# Patient Record
Sex: Male | Born: 1965 | Race: White | Hispanic: No | Marital: Married | Smoking: Never smoker
Health system: Southern US, Community
[De-identification: ages and names within clinical notes are randomized; demographics above are authoritative.]

## PROBLEM LIST (undated history)

## (undated) DIAGNOSIS — F32A Depression, unspecified: Secondary | ICD-10-CM

## (undated) DIAGNOSIS — F419 Anxiety disorder, unspecified: Secondary | ICD-10-CM

## (undated) DIAGNOSIS — G4700 Insomnia, unspecified: Secondary | ICD-10-CM

## (undated) DIAGNOSIS — I10 Essential (primary) hypertension: Secondary | ICD-10-CM

## (undated) DIAGNOSIS — F329 Major depressive disorder, single episode, unspecified: Secondary | ICD-10-CM

## (undated) DIAGNOSIS — F1011 Alcohol abuse, in remission: Secondary | ICD-10-CM

## (undated) DIAGNOSIS — G8929 Other chronic pain: Secondary | ICD-10-CM

## (undated) HISTORY — DX: Depression, unspecified: F32.A

## (undated) HISTORY — DX: Insomnia, unspecified: G47.00

## (undated) HISTORY — DX: Essential (primary) hypertension: I10

## (undated) HISTORY — DX: Other chronic pain: G89.29

## (undated) HISTORY — DX: Alcohol abuse, in remission: F10.11

## (undated) HISTORY — DX: Major depressive disorder, single episode, unspecified: F32.9

## (undated) HISTORY — DX: Anxiety disorder, unspecified: F41.9

## (undated) HISTORY — PX: CHOLECYSTECTOMY: SHX55

---

## 2000-03-23 ENCOUNTER — Emergency Department (HOSPITAL_COMMUNITY): Admission: EM | Admit: 2000-03-23 | Discharge: 2000-03-23 | Payer: Self-pay | Admitting: Emergency Medicine

## 2001-09-12 ENCOUNTER — Emergency Department (HOSPITAL_COMMUNITY): Admission: EM | Admit: 2001-09-12 | Discharge: 2001-09-12 | Payer: Self-pay | Admitting: *Deleted

## 2006-01-18 ENCOUNTER — Emergency Department (HOSPITAL_COMMUNITY): Admission: EM | Admit: 2006-01-18 | Discharge: 2006-01-18 | Payer: Self-pay | Admitting: Emergency Medicine

## 2006-04-06 ENCOUNTER — Emergency Department (HOSPITAL_COMMUNITY): Admission: EM | Admit: 2006-04-06 | Discharge: 2006-04-06 | Payer: Self-pay | Admitting: Family Medicine

## 2006-08-05 ENCOUNTER — Emergency Department (HOSPITAL_COMMUNITY): Admission: EM | Admit: 2006-08-05 | Discharge: 2006-08-05 | Payer: Self-pay | Admitting: Emergency Medicine

## 2006-08-09 ENCOUNTER — Emergency Department (HOSPITAL_COMMUNITY): Admission: EM | Admit: 2006-08-09 | Discharge: 2006-08-09 | Payer: Self-pay | Admitting: Emergency Medicine

## 2006-08-18 ENCOUNTER — Inpatient Hospital Stay (HOSPITAL_COMMUNITY): Admission: EM | Admit: 2006-08-18 | Discharge: 2006-08-23 | Payer: Self-pay | Admitting: Emergency Medicine

## 2006-08-18 ENCOUNTER — Encounter (INDEPENDENT_AMBULATORY_CARE_PROVIDER_SITE_OTHER): Payer: Self-pay | Admitting: Surgery

## 2007-07-07 ENCOUNTER — Emergency Department (HOSPITAL_COMMUNITY): Admission: EM | Admit: 2007-07-07 | Discharge: 2007-07-07 | Payer: Self-pay | Admitting: Emergency Medicine

## 2007-07-14 ENCOUNTER — Emergency Department (HOSPITAL_COMMUNITY): Admission: EM | Admit: 2007-07-14 | Discharge: 2007-07-14 | Payer: Self-pay | Admitting: Emergency Medicine

## 2008-01-03 ENCOUNTER — Emergency Department (HOSPITAL_COMMUNITY): Admission: EM | Admit: 2008-01-03 | Discharge: 2008-01-03 | Payer: Self-pay | Admitting: Emergency Medicine

## 2008-03-04 ENCOUNTER — Emergency Department (HOSPITAL_COMMUNITY): Admission: EM | Admit: 2008-03-04 | Discharge: 2008-03-04 | Payer: Self-pay | Admitting: Emergency Medicine

## 2008-04-11 ENCOUNTER — Inpatient Hospital Stay (HOSPITAL_COMMUNITY): Admission: EM | Admit: 2008-04-11 | Discharge: 2008-04-14 | Payer: Self-pay | Admitting: Emergency Medicine

## 2008-05-03 ENCOUNTER — Emergency Department (HOSPITAL_COMMUNITY): Admission: EM | Admit: 2008-05-03 | Discharge: 2008-05-04 | Payer: Self-pay | Admitting: Emergency Medicine

## 2009-08-26 ENCOUNTER — Emergency Department (HOSPITAL_COMMUNITY): Admission: EM | Admit: 2009-08-26 | Discharge: 2009-08-26 | Payer: Self-pay | Admitting: Emergency Medicine

## 2009-08-27 ENCOUNTER — Inpatient Hospital Stay (HOSPITAL_COMMUNITY): Admission: EM | Admit: 2009-08-27 | Discharge: 2009-08-29 | Payer: Self-pay | Admitting: Emergency Medicine

## 2010-03-28 LAB — CBC
HCT: 36.3 % — ABNORMAL LOW (ref 39.0–52.0)
HCT: 40.8 % (ref 39.0–52.0)
HCT: 41.4 % (ref 39.0–52.0)
HCT: 42.3 % (ref 39.0–52.0)
Hemoglobin: 12.7 g/dL — ABNORMAL LOW (ref 13.0–17.0)
Hemoglobin: 14 g/dL (ref 13.0–17.0)
Hemoglobin: 14.4 g/dL (ref 13.0–17.0)
Hemoglobin: 14.9 g/dL (ref 13.0–17.0)
Hemoglobin: 15.4 g/dL (ref 13.0–17.0)
MCH: 31.4 pg (ref 26.0–34.0)
MCH: 31.8 pg (ref 26.0–34.0)
MCHC: 34.9 g/dL (ref 30.0–36.0)
MCHC: 35.2 g/dL (ref 30.0–36.0)
MCV: 89.4 fL (ref 78.0–100.0)
MCV: 90.5 fL (ref 78.0–100.0)
MCV: 90.5 fL (ref 78.0–100.0)
MCV: 90.9 fL (ref 78.0–100.0)
Platelets: 185 10*3/uL (ref 150–400)
Platelets: 215 10*3/uL (ref 150–400)
RBC: 4.57 MIL/uL (ref 4.22–5.81)
RBC: 4.73 MIL/uL (ref 4.22–5.81)
RBC: 4.84 MIL/uL (ref 4.22–5.81)
RDW: 12.4 % (ref 11.5–15.5)
RDW: 13.1 % (ref 11.5–15.5)
RDW: 13.2 % (ref 11.5–15.5)
WBC: 13 10*3/uL — ABNORMAL HIGH (ref 4.0–10.5)
WBC: 15.9 10*3/uL — ABNORMAL HIGH (ref 4.0–10.5)
WBC: 9.6 10*3/uL (ref 4.0–10.5)

## 2010-03-28 LAB — BASIC METABOLIC PANEL
BUN: 3 mg/dL — ABNORMAL LOW (ref 6–23)
Chloride: 101 mEq/L (ref 96–112)
Chloride: 99 mEq/L (ref 96–112)
GFR calc Af Amer: >60 mL/min (ref >60)
GFR calc non Af Amer: >60 mL/min (ref >60)
GFR calc non Af Amer: >60 mL/min (ref >60)
Glucose, Bld: 124 mg/dL — ABNORMAL HIGH (ref 70–99)
Potassium: 3.6 mEq/L (ref 3.5–5.1)
Potassium: 4.7 mEq/L (ref 3.5–5.1)
Sodium: 132 mEq/L — ABNORMAL LOW (ref 135–145)
Sodium: 137 mEq/L (ref 135–145)

## 2010-03-28 LAB — URINALYSIS, ROUTINE W REFLEX MICROSCOPIC
Bilirubin Urine: NEGATIVE
Glucose, UA: NEGATIVE mg/dL
Ketones, ur: NEGATIVE mg/dL
Nitrite: NEGATIVE
Specific Gravity, Urine: 1.017 (ref 1.005–1.030)
Urobilinogen, UA: 0.2 mg/dL (ref 0.0–1.0)

## 2010-03-28 LAB — POCT I-STAT, CHEM 8
BUN: 13 mg/dL (ref 6–23)
Calcium, Ion: 1.08 mmol/L — ABNORMAL LOW (ref 1.12–1.32)
Chloride: 100 mEq/L (ref 96–112)
Creatinine, Ser: 1 mg/dL (ref 0.4–1.5)
HCT: 45 % (ref 39.0–52.0)
Potassium: 3.9 mEq/L (ref 3.5–5.1)
Sodium: 134 mEq/L — ABNORMAL LOW (ref 135–145)

## 2010-03-28 LAB — COMPREHENSIVE METABOLIC PANEL
ALT: 72 U/L — ABNORMAL HIGH (ref 0–53)
AST: 21 U/L (ref 0–37)
AST: 23 U/L (ref 0–37)
Albumin: 3.4 g/dL — ABNORMAL LOW (ref 3.5–5.2)
Alkaline Phosphatase: 45 U/L (ref 39–117)
Alkaline Phosphatase: 53 U/L (ref 39–117)
BUN: 14 mg/dL (ref 6–23)
BUN: 7 mg/dL (ref 6–23)
CO2: 25 mEq/L (ref 19–32)
Calcium: 8.5 mg/dL (ref 8.4–10.5)
Chloride: 99 mEq/L (ref 96–112)
Chloride: 99 mEq/L (ref 96–112)
Creatinine, Ser: 0.87 mg/dL (ref 0.4–1.5)
GFR calc Af Amer: >60 mL/min (ref >60)
GFR calc non Af Amer: >60 mL/min (ref >60)
Glucose, Bld: 120 mg/dL — ABNORMAL HIGH (ref 70–99)
Potassium: 4.1 mEq/L (ref 3.5–5.1)
Potassium: 4.3 mEq/L (ref 3.5–5.1)
Sodium: 131 mEq/L — ABNORMAL LOW (ref 135–145)
Sodium: 134 mEq/L — ABNORMAL LOW (ref 135–145)
Total Protein: 6.6 g/dL (ref 6.0–8.3)

## 2010-03-28 LAB — RAPID URINE DRUG SCREEN, HOSP PERFORMED
Amphetamines: NOT DETECTED
Cocaine: NOT DETECTED

## 2010-03-28 LAB — MAGNESIUM: Magnesium: 1.5 mg/dL (ref 1.5–2.5)

## 2010-03-28 LAB — LIPID PANEL
LDL Cholesterol: 31 mg/dL (ref 0–99)
Total CHOL/HDL Ratio: 2.1 RATIO
VLDL: 16 mg/dL (ref 0–40)

## 2010-03-28 LAB — DIFFERENTIAL
Basophils Absolute: 0.1 10*3/uL (ref 0.0–0.1)
Eosinophils Absolute: 0 10*3/uL (ref 0.0–0.7)
Eosinophils Relative: 2 % (ref 0–5)
Lymphs Abs: 0.7 10*3/uL (ref 0.7–4.0)

## 2010-03-28 LAB — PHOSPHORUS: Phosphorus: 3.2 mg/dL (ref 2.3–4.6)

## 2010-03-28 LAB — APTT: aPTT: 28 seconds (ref 24–37)

## 2010-03-28 LAB — PROTIME-INR
INR: 0.99 (ref 0.00–1.49)
Prothrombin Time: 13.3 seconds (ref 11.6–15.2)

## 2010-03-28 LAB — LIPASE, BLOOD: Lipase: 52 U/L (ref 11–59)

## 2010-04-23 LAB — CBC
HCT: 44 % (ref 39.0–52.0)
HCT: 38.9 % — ABNORMAL LOW (ref 39.0–52.0)
Hemoglobin: 13.5 g/dL (ref 13.0–17.0)
MCHC: 34.5 g/dL (ref 30.0–36.0)
MCV: 95.5 fL (ref 78.0–100.0)
MCV: 95.9 fL (ref 78.0–100.0)
Platelets: 257 10*3/uL (ref 150–400)
RDW: 12.6 % (ref 11.5–15.5)
RDW: 13.2 % (ref 11.5–15.5)

## 2010-04-23 LAB — URINALYSIS, ROUTINE W REFLEX MICROSCOPIC
Glucose, UA: NEGATIVE mg/dL
Hgb urine dipstick: NEGATIVE
Protein, ur: NEGATIVE mg/dL
pH: 6 (ref 5.0–8.0)

## 2010-04-23 LAB — POTASSIUM: Potassium: 4.9 mEq/L (ref 3.5–5.1)

## 2010-04-23 LAB — LIPASE, BLOOD: Lipase: 42 U/L (ref 11–59)

## 2010-04-23 LAB — COMPREHENSIVE METABOLIC PANEL
AST: 24 U/L (ref 0–37)
Albumin: 4.4 g/dL (ref 3.5–5.2)
BUN: 6 mg/dL (ref 6–23)
Calcium: 9.7 mg/dL (ref 8.4–10.5)
Creatinine, Ser: 0.9 mg/dL (ref 0.4–1.5)
GFR calc Af Amer: >60 mL/min (ref >60)
Total Bilirubin: 0.7 mg/dL (ref 0.3–1.2)
Total Protein: 7.2 g/dL (ref 6.0–8.3)

## 2010-04-23 LAB — DIFFERENTIAL
Basophils Absolute: 0 10*3/uL (ref 0.0–0.1)
Eosinophils Relative: 3 % (ref 0–5)
Lymphocytes Relative: 8 % — ABNORMAL LOW (ref 12–46)
Lymphocytes Relative: 7 % — ABNORMAL LOW (ref 12–46)
Lymphs Abs: 0.9 10*3/uL (ref 0.7–4.0)
Lymphs Abs: 0.7 10*3/uL (ref 0.7–4.0)
Monocytes Absolute: 0.5 10*3/uL (ref 0.1–1.0)
Monocytes Absolute: 0.9 10*3/uL (ref 0.1–1.0)
Monocytes Relative: 5 % (ref 3–12)
Neutro Abs: 9 10*3/uL — ABNORMAL HIGH (ref 1.7–7.7)

## 2010-04-23 LAB — BASIC METABOLIC PANEL
Chloride: 104 mEq/L (ref 96–112)
GFR calc non Af Amer: >60 mL/min (ref >60)
Glucose, Bld: 99 mg/dL (ref 70–99)
Potassium: 5.2 mEq/L — ABNORMAL HIGH (ref 3.5–5.1)
Sodium: 136 mEq/L (ref 135–145)

## 2010-04-23 LAB — RAPID URINE DRUG SCREEN, HOSP PERFORMED
Amphetamines: NOT DETECTED
Barbiturates: NOT DETECTED
Benzodiazepines: NOT DETECTED
Cocaine: NOT DETECTED

## 2010-04-24 LAB — RAPID URINE DRUG SCREEN, HOSP PERFORMED
Amphetamines: NOT DETECTED
Barbiturates: NOT DETECTED
Benzodiazepines: POSITIVE — AB
Cocaine: NOT DETECTED
Opiates: POSITIVE — AB

## 2010-04-24 LAB — DIFFERENTIAL
Basophils Relative: 0 % (ref 0–1)
Eosinophils Absolute: 0.2 10*3/uL (ref 0.0–0.7)
Neutro Abs: 11.9 10*3/uL — ABNORMAL HIGH (ref 1.7–7.7)
Neutrophils Relative %: 88 % — ABNORMAL HIGH (ref 43–77)

## 2010-04-24 LAB — URINE MICROSCOPIC-ADD ON

## 2010-04-24 LAB — COMPREHENSIVE METABOLIC PANEL
ALT: 68 U/L — ABNORMAL HIGH (ref 0–53)
Alkaline Phosphatase: 66 U/L (ref 39–117)
BUN: 5 mg/dL — ABNORMAL LOW (ref 6–23)
CO2: 22 mEq/L (ref 19–32)
Calcium: 8.6 mg/dL (ref 8.4–10.5)
GFR calc non Af Amer: >60 mL/min (ref >60)
Glucose, Bld: 125 mg/dL — ABNORMAL HIGH (ref 70–99)
Sodium: 124 mEq/L — ABNORMAL LOW (ref 135–145)

## 2010-04-24 LAB — URINALYSIS, ROUTINE W REFLEX MICROSCOPIC
Leukocytes, UA: NEGATIVE
Nitrite: NEGATIVE
Specific Gravity, Urine: 1.022 (ref 1.005–1.030)
Urobilinogen, UA: 1 mg/dL (ref 0.0–1.0)

## 2010-04-24 LAB — SALICYLATE LEVEL: Salicylate Lvl: <4 mg/dL (ref 2.8–20.0)

## 2010-04-24 LAB — LIPASE, BLOOD: Lipase: 340 U/L — ABNORMAL HIGH (ref 11–59)

## 2010-04-24 LAB — CBC
HCT: 46.8 % (ref 39.0–52.0)
Hemoglobin: 16.3 g/dL (ref 13.0–17.0)
MCHC: 34.7 g/dL (ref 30.0–36.0)
RBC: 4.93 MIL/uL (ref 4.22–5.81)

## 2010-04-24 LAB — ACETAMINOPHEN LEVEL: Acetaminophen (Tylenol), Serum: <10 ug/mL — ABNORMAL LOW (ref 10–30)

## 2010-05-27 NOTE — Consult Note (Signed)
NAMERUTLEDGE, SELSOR NO.:  [id number]   MEDICAL RECORD NO.:  [id number]          PATIENT TYPE:  INP   LOCATION:  1414                         FACILITY:  [facility]   PHYSICIAN:  Antonietta Breach, M.D.  DATE OF BIRTH:  1965-01-27   DATE OF CONSULTATION:  04/12/2008  DATE OF DISCHARGE:                                 CONSULTATION   REQUESTING PHYSICIAN:  Jacqulyn Bath Team.   REASON FOR CONSULTATION:  Anxiety, depression, alcohol dependence.   HISTORY OF PRESENT ILLNESS:  [name] is a 45 year old male  admitted to the [facility] on April 11, 2008, due to acute  pancreatitis and hyponatremia.   [name] has been drinking at least 8 beers per day for over 4 weeks.  He describes his mood as within normal limits.  He has constructive  interests and future goals.  He notes that he has obtained a new job.  He is not having any thoughts of harming himself or others.  He has no  hallucinations or delusions.  He is socially appropriate and  cooperative.  However, he does have some decreased energy.  He does have  some mild decreased concentration.   He does continue on his Prozac 40 mg daily and did recently restart it.  He stated that he was having difficulty with the cost of the medicine.  He does still take Xanax as an outpatient for anti-anxiety; however, he  has continued to drink with it as well.   He presented to the [facility] with severe abdominal pain and  pancreatitis.   PAST PSYCHIATRIC HISTORY:  [name] has no history of increased energy  or decreased need for sleep.   He has undergone an admission for alcohol residential rehabilitation in  the past.   In review of the past medical record in August 2008, he was listed then  as having anxiety on his problem list and was on Prozac and Xanax at  that time.   FAMILY PSYCHIATRIC HISTORY:  [name] reports that he has an older  sister who has been diagnosed with bipolar disorder and  is on several  psychotropic agents.   SOCIAL HISTORY:  Marital status single.  He was working at [employer], now has  a new occupation.  He does not use any illegal drugs.  He has worked as  a Location manager.  [name] smokes tobacco regularly and is having  nicotine cravings with a 14 mg nicotine daily patch.   PAST MEDICAL HISTORY:  Pancreatitis.   MEDICATIONS:  His MAR is reviewed.  He is on the Ativan withdrawal  protocol, second day.  He is on Prozac 40 mg daily, thiamine 100 mg  daily.   ALLERGIES:  1. PENICILLIN.  2. SULFA.   LABORATORY DATA:  Lipase 134, amylase 432, sodium 136, BUN 6, creatinine  0.99, glucose 99, WBC 10.5, hemoglobin 13.5, platelet count 161.  Tricyclics negative.  Aspirin negative.  Alcohol was negative.   SGOT 28, SGPT 68.  Tylenol negative.  Urine drug screen positive  opiates, positive benzodiazepines.  [name] primary doctor had given  him Vicodin for the abdominal pain just prior to admission.   REVIEW OF SYSTEMS:  Constitutional, head, eyes, ears, nose, throat,  mouth, neurologic, psychiatric, cardiovascular, respiratory,  gastrointestinal, genitourinary, skin, musculoskeletal, hematologic,  lymphatic, endocrine and metabolic all unremarkable.   PHYSICAL EXAMINATION:  VITAL SIGNS:  Temperature 99.3, pulse 133,  respiratory rate is 20, blood pressure 132/82.  O2 saturation on room  air 94%.  Bilateral hand extension, no tremor.  He has no sweats.  GENERAL APPEARANCE:  [name] is a middle-aged male, partially  reclined in a supine position in his hospital bed with no abnormal  involuntary movements.   MENTAL STATUS EXAM:  [name] is alert.  His eye contact is good.  His  concentration is slightly decreased.  His affect is mildly anxious at  baseline, but with a broad and appropriate range.  His mood is mildly  anxious.  He is oriented to all spheres.  Memory is intact to immediate,  recent and remote.  Fund of knowledge and intelligence  are normal.  Speech involves normal rate and prosody without dysarthria.  Thought  process is logical, coherent and goal-directed.  No looseness of  associations.  Thought content; no thoughts of harming himself or  others.  No delusions or hallucinations.  Insight is intact.  Judgment  is intact.   ASSESSMENT:  Axis I:  293.84, anxiety disorder, not otherwise specified.  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  Axis II:  Deferred.  Axis III:  See past medical history.  Axis IV:  Occupational, primary support group.  Axis V:  Global Assessment of Functioning 55.   [name] is not at risk to harm himself or others.  He does agree to  call emergency services immediately for any thoughts of harming himself,  thoughts of harming others or distress.   The undersigned provided ego supportive psychotherapy and education.  The 12-step principles were emphasized.   The indications, alternatives and adverse effects of the following were  discussed:  Prozac for anti-depression, anti-anxiety; Ativan protocol  for preventing alcohol and sedative hypnotic withdrawal; nicotine patch  for nicotine cravings.   RECOMMENDATIONS:  1. [name] understands the medication discussion above and wants to      proceed.  We will increase his nicotine patch to 25 mg daily  2. No change in his Prozac 40 mg daily for anti-depression, anti-      anxiety.  However, would monitor for any loose stools or nausea      that might be secondary to the Prozac.  3. Would proceed with the Ativan withdrawal protocol.  4. The undersigned recommends that [name] be admitted to an      inpatient psychiatric unit for dual diagnosis treatment.  5. Also recommended is a chemical dependence residential      rehabilitation program.   However, at  this time [name] desires to undergo the Ativan withdrawal  protocol in the general medical hospital and then be discharged to  outpatient treatment.   [name] is not committable and he does have the capacity for informed  consent.   If he does not take the undersigns recommendations as above, would ask  the social worker to arrange intensive outpatient psychiatric care at  one of the CD IOPs locally.  They are available at [facility],  [facility] and [facility], and there is a strong possibility that  he could arrange an IOP to fit his work schedule.   Twelve-step groups and principals.   [name] would likely be an excellent candidate for cognitive  behavioral therapy combined with deep breathing and progressive muscle  relaxation, this could improve his anxiety.   [name] agrees to not drive if drowsy.      Antonietta Breach, M.D.  Electronically Signed     JW/MEDQ  D:  04/12/2008  T:  04/12/2008  Job:  [id number]

## 2010-05-27 NOTE — H&P (Signed)
[name], KRISE NO.:  [id number]   MEDICAL RECORD NO.:  [id number]          PATIENT TYPE:  INP   LOCATION:  0106                         FACILITY:  [facility]   PHYSICIAN:  Clovis Pu. Cornett, M.D.DATE OF BIRTH:  [birth year]   DATE OF ADMISSION:  08/18/2006  DATE OF DISCHARGE:                              HISTORY & PHYSICAL   CHIEF COMPLAINT:  Epigastric abdominal pain.   HISTORY OF PRESENT ILLNESS:  The patient is a 45 year old male with a  two day history of epigastric pain.  The pain started two days ago after  eating some chicken salad.  He is a heavy alcohol drinker drinking a 12-  pack of beer at least a day.  He is also a heavy smoker.  The pain is  sharp in intensity, located in his epigastrium with no radiation.  Currently his pain is much better than it was when he first came to the  emergency room earlier today.  It is associated with nausea and vomiting  with no blood in his emesis or stool.  He has never had pain like this  before.  No history of ulcer disease or other medical problems as far as  he knows.  He is a very heavy drinker he states though.  The pain is  8/10, sharp in intensity, located in his epigastrium without radiation.  He took Mylanta for it and it did not help.  CT scan was obtained by the  emergency room doctor which showed a few locules of free air in the  right upper quadrant.  No free fluid.  Some mild edema of the head of  the pancreas and what appeared to possible duodenitis with some edema of  the duodenum.  There is also what appeared to some pneumatosis involving  the cecum and right colon.  Currently, he says he feels much better with  minimal pain at this point in time.   PAST MEDICAL HISTORY:  1. Heavy alcohol use.  2. Heavy tobacco use.  3. Anxiety.   PAST SURGICAL HISTORY:  None.   FAMILY HISTORY:  Noncontributory.   SOCIAL HISTORY:  He is not married.  He has a girlfriend.  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  Smokes and drinks as above quite heavily.   ALLERGIES TO MEDICINES:  PENICILLIN AND SULFA.   MEDICATIONS:  1. Mylanta.  2. Pepto-Bismol.  3. Prozac.  4. Xanax.   REVIEW OF SYSTEMS:  A 15-point review of systems as stated above,  otherwise it is negative.   PHYSICAL EXAMINATION:  VITAL SIGNS:  Temperature 97, pulse 71, blood  pressure 152/101.  GENERAL APPEARANCE:  Pleasant male resting comfortably watching TV in no  apparent distress.  HEENT: No scleral icterus.  Oropharynx is moist.  NECK: Supple, nontender.  Trachea midline.  No mass.  CHEST:  Lung sounds are clear to auscultation bilaterally.  Chest wall  motion is normal.  CARDIOVASCULAR:  Regular rate and rhythm without murmur or gallop.  Extremities are warm and well-perfused.  ABDOMEN:  Soft, minimally tender in the  epigastrium without rebound,  without guarding or signs of peritonitis.  No evidence of hernia or  hepatomegaly.  EXTREMITIES:  Muscle tone is normal.  Range of motion is normal.  NEUROLOGIC EXAMINATION:  Glasgow coma scale is 15.  Motor and sensory  function are grossly intact.  No obvious neuro deficits noted.  PSYCHIATRIC:  His mood is pleasant.  He is cooperative.  No signs of  depression or suicidal type behavior noted.  SKIN:  Normal.   DIAGNOSTIC STUDIES:  I have reviewed his CT scan.  There are a few small  locules of right upper quadrant air, minimal thickening of the head of  pancreas without any significant peripancreatic fluid collections,  minimal edema of the duodenum without extravasation of contrast.  The  locules of air under the right lobe of the liver are quite small.  Questionable pneumatosis of the right colon even though there is no  beginning of the colon at this point in time or signs of inflammatory  change.  His white count is 8600 with no evidence of left shift.  Hemoglobin is 15.4.  Sodium 139, potassium 4.2, chloride 103, CO2 26,  BUN 5, creatinine 1.0, glucose 121,  AST and ALT within normal limits.  Urinalysis is normal.  Lipase is elevated at 152.   IMPRESSION:  1. Small amount of free intra-abdominal air with relatively benign      abdominal examination without signs of sepsis or peritonitis.      Differential diagnosis includes perforated duodenal ulcer,      perforated gastric ulcer, perforated colon.  2. Heavy alcohol use.  3. Heavy tobacco abuse.   DISCUSSION:  At this point in time his clinical exam is quite negative,  but I do feel laparoscopy would be helpful to further evaluate this free  intra-abdominal air and to look his colon.  He has no white count or  elevated shift.  His abdomen is relatively benign and I have to wonder  if he had a small duodenal ulcer that he perforated and it sealed itself  off.  It is unclear to me if he truly has pneumatosis looking at his CT  with air and stool in his colon and clinically does not behave like he  has pneumatosis.  At this point in time I feel he needs to go to the  operating room later on today for laparoscopy to further evaluate him  completely.  He also is heavy drinker and will put him on DT prophylaxis  at this point in time as well as peptic ulcer disease treatment  including Protonix and will send a serology on H. pylori as well.  He  will be made n.p.o.  He does have mild acute pancreatitis and this is  probably from alcohol use, but I do not see any severe evidence of  peripancreatic fluid or pancreatic necrosis or cyst.  He will be  admitted to the ICU for observation and taken later to the operating  room for laparoscopy, possible laparotomy depending on what is found.  I  have explained all this to him, he understands and agrees to proceed.      Thomas A. Cornett, M.D.  Electronically Signed     TAC/MEDQ  D:  08/18/2006  T:  08/18/2006  Job:  [id number]

## 2010-05-27 NOTE — Discharge Summary (Signed)
NAMEQUANTAVIUS, HUMM NO.:  [id number]   MEDICAL RECORD NO.:  [id number]          PATIENT TYPE:  INP   LOCATION:  1326                         FACILITY:  [facility]   PHYSICIAN:  Clovis Pu. Cornett, M.D.DATE OF BIRTH:  [birth year]   DATE OF ADMISSION:  08/18/2006  DATE OF DISCHARGE:  08/23/2006                               DISCHARGE SUMMARY   ADMISSION DIAGNOSES:  1. Free intra-abdominal air on CT scan.  2. Acute pancreatitis secondary to alcohol abuse.  3. Essential hypertension.  4. Alcohol dependency.   DISCHARGE DIAGNOSES:  1. Free intra-abdominal air on CT scan.  2. Acute pancreatitis secondary to alcohol abuse.  3. Essential hypertension.  4. Alcohol dependency.   PROCEDURE PERFORMED:  Diagnostic laparoscopy with laparoscopic  cholecystectomy with cholangiogram secondary to iatrogenic injury to  gallbladder.   BRIEF HISTORY:  The patient is a 45 year old male who was admitted from  the emergency room on August 18, 2006, with abdominal pain.  He has a  history of alcohol abuse.  CT showed locules of free intra-abdominal air  in the right upper quadrant and questionable pneumatosis intestinalis.  He was not acutely ill but did have abdominal pain and discomfort on  examination and was taken to the operating room for a diagnostic  laparoscopy to further evaluate the free intra-abdominal air.  Please  see operative note for details of this.   HOSPITAL COURSE:  The patient was admitted to the intensive care unit  for DT prophylaxis due to his alcohol abuse.  His postop course was  noted to have a bump in his amylase and lipase on postoperative day #1.  He was placed on TNA and made n.p.o. and was also on Cipro as well.  Of  note, his laparoscopy revealed no source of his free intra-abdominal  air.  He did not have pneumatosis.  He was in the ICU for 2 days then  sent to the floor after this.  He was weaned off his Ativan for his DT  prophylaxis, received a  rally pack, and felt much better by postop day  #4.  He had a JP drain in place, which was removed.  His diet was  advanced.  He was placed on a  low-fat diet.  He seemed to have no  abdominal pain with that.  He was feeling well and discharged home on  postoperative day #5 in satisfactory condition.  Of note, he had trouble  with hypertension.  During his hospital stay he was placed on Lopressor  50 mg p.o. daily with good control of his blood pressure.   DISCHARGE INSTRUCTIONS:  He will return to see me in 2 weeks.  He has  already told me that he has an appointment for alcohol dependency  treatment.  He was actually scheduled to go see this clinic when he  developed pancreatitis.  I have asked to stay on a low-fat diet.  He  will go home on Lopressor 50 mg daily and he will to need to find  primary care doctor to help manage his blood pressure, but  his blood  pressure is very good in the 130s/80s  here in the hospital.  He will refrain from alcohol consumption.  He has  a prescription for Percocet 7.5/325 mg one to two q.4h. p.r.n. every 6  hours for pain.  He will resume work in 2 weeks and refrain from lifting  until that time.   CONDITION ON DISCHARGE:  Improved.      Thomas A. Cornett, M.D.  Electronically Signed     TAC/MEDQ  D:  08/23/2006  T:  08/24/2006  Job:  [id number]

## 2010-05-27 NOTE — H&P (Signed)
[name], [name]                  ACCOUNT NO.:  [id number]   MEDICAL RECORD NO.:  [id number]          PATIENT TYPE:  INP   LOCATION:  0104                         FACILITY:  [facility]   PHYSICIAN:  Kela Millin, M.D.DATE OF BIRTH:  10-23-1965   DATE OF ADMISSION:  04/11/2008  DATE OF DISCHARGE:                              HISTORY & PHYSICAL   PRIMARY CARE PHYSICIAN:  Dr. Elsworth Soho   CHIEF COMPLAINT:  Worsening epigastric pain.   HISTORY OF PRESENT ILLNESS:  The patient is a 45 year old white male  with past medical history significant for pancreatitis, secondary to  alcohol abuse; hypertension, and anxiety who presents with above  complaints.  He states that he began having upper abdominal pain about 5  days ago, described as dull, constant pain and radiating to his back.  He reports the pain is mostly in his epigastric area, but he has also  has some pain in his left side.  He also states that initially he had  nausea, vomiting for 2 days, but he saw his primary care physician and  he was given Phenergan suppositories as well as Vicodin.  The Phenergan  suppositories helped with the nausea/vomiting, but he states his pain  continued to get worse, even with the Vicodin, and so he came to the ED  today.  He admits to drinking 6-7 beers per day and that his last drink  was 2 days ago.  He states that he had called [facility] to see  about a admission for alcohol detox and was asked to come to the ED for  evaluation and medical clearance today.  He admits to decreased p.o.  intake for the past several days.  He states that the abdominal pain was  9/10 in intensity at its worst.  He denies cough, fevers, dysuria,  diarrhea, melena, hematochezia and no hematemesis.   He was seen in the ED and a serum lipase was done and it was elevated at  340.  His urinalysis was negative for infection and abdominal films  showed a single gas-filled loop of small bowel in the right  lower  quadrant that may be due to focal inflammatory process, no evidence of  bowel obstruction.  His white cell count was 13.5, his alcohol level  less than 5.  He was admitted for further evaluation and management.   PAST MEDICAL HISTORY:  As above.   MEDICATIONS:  1. Xanax 0.5 mg t.i.d.  2. Prozac 40 mg daily.  3. Phenergan p.r.n.  4. Vicodin p.r.n.   ALLERGIES:  PENICILLIN and SULFA.   SOCIAL HISTORY:  As above, he drinks 6-7 beers daily, and his last drink  was 2 days ago.  Positive for tobacco, one pack per day for 15 years.   FAMILY HISTORY:  His mother is deceased and had colon and breast cancer.   REVIEW OF SYSTEMS:  As per HPI, other review of systems negative.   PHYSICAL EXAM:  IN GENERAL:  The patient is a middle-aged white male, in  no apparent distress.  He is alert and appropriate.  VITAL SIGNS:  His temperature is 97.9, blood pressure 170/115, pulse of  137, respiratory rate of 18, O2 sat of 98%.  HEENT:  PERRL, EOMI, dry mucous membranes, no oral exudates, sclerae  anicteric.  NECK:  Supple, no adenopathy, no thyromegaly and no JVD.  LUNGS:  Clear to auscultation bilaterally.  No crackles or wheezes.  CARDIOVASCULAR:  Tachycardiac, regular rhythm.  ABDOMEN:  Soft, bowel sounds present, tender across his upper abdomen  but greater tenderness in the epigastrium and left upper quadrant.  No  rebound tenderness, no masses palpable.  EXTREMITIES:  No clubbing, cyanosis or edema.  No tremors.  NEURO:  He is alert and oriented x3.  Cranial nerves II-XII grossly  intact.  Nonfocal exam.   LABORATORY DATA:  White cell count is 13.5, hemoglobin is 16.3,  hematocrit 46.8, platelet count is 206, neutrophil count is 88%.  The  urinalysis is negative for infection.  Urine drug screen positive for  opiates, otherwise negative.  His lipase is 340.  Tylenol level less  than 10.  Alcohol level less than 5.  Salicylate level less than 4.  His  sodium is 124, potassium 3.5,  chloride 93, CO2 of 22, glucose 125, BUN  of 5, creatinine 0.89, calcium 8.6, total protein 6.1.  AST is 28, total  bilirubin is 1.7, alkaline phosphatase is 66, ALT is 68.  Chest x-ray:  No acute cardiopulmonary disease.  Abdominal films as per  HPI.   ASSESSMENT AND PLAN:  1. Acute pancreatitis - recurrent, as discussed above.  Intravenous      narcotics for pain management, hydrate with IV fluids and keep      n.p.o. for now.  Patient counseled to quit alcohol.  2. Hyponatremia - likely prerenal as patient with decreased p.o.      intake for the past several days.  Will hydrate, follow recheck,      consider further workup if no improvement.  3. Uncontrolled hypertension - pain a contributing factor, as well as      alcohol.  Also, as noted above, he had been on Lopressor previously      but discontinued when he quit alcohol in the past.  We will monitor      blood pressures and treat accordingly, p.r.n. clonidine for now and      pain management.  4. Anxiety disorder - continue Prozac, Ativan for now as per detox      protocol.  5. Alcohol abuse - as above, patient counseled to quit.  He states he      was trying to get admitted to [facility] for detox.  We will consult psych      once he is medically stable for a possible transfer for inpatient      psych treatment.      Kela Millin, M.D.  Electronically Signed     ACV/MEDQ  D:  04/11/2008  T:  04/11/2008  Job:  [id number]   cc:   L. Lupe Carney, M.D.  Fax: [phone]

## 2010-05-27 NOTE — Discharge Summary (Signed)
[name], [name]                  ACCOUNT NO.:  [id number]   MEDICAL RECORD NO.:  [id number]          PATIENT TYPE:  INP   LOCATION:  1414                         FACILITY:  [facility]   PHYSICIAN:  Kela Millin, M.D.DATE OF BIRTH:  25-Jan-1965   DATE OF ADMISSION:  04/11/2008  DATE OF DISCHARGE:  04/14/2008                               DISCHARGE SUMMARY   DISCHARGE DIAGNOSES:  1. Acute pancreatitis, recurrent.  2. Alcohol dependence.  3. Uncontrolled hypertension.  4. Hyponatremia, resolved.  5. History of anxiety disorder.   CONSULTATIONS:  Psychiatry, Dr. Jeanie Sewer.   BRIEF HISTORY:  The patient is a 45 year old white male with the above-  listed medical problems who presented with complaints of abdominal pain.  He also reported that he had been having nausea and vomiting.  He saw  his primary care physician and so he was given Phenergan suppositories  and Vicodin, but his pain was worsening.  He reports that he initially  called behavioral health because he wanted to be admitted for alcohol  detox, but he was asked to come to the ED for evaluation and medical  clearance.  In the ED he was have found to have elevated lipase and he  was admitted for further evaluation and management.   Please see the full dictated admission history and physical for the  details of the admission physical exam as well as the laboratory data.   HOSPITAL COURSE:  1. Acute pancreatitis, recurrent:  In the ED he had a lipase level      which was found to be elevated at 340.  He admitted to continuing      to drink alcohol, 6-7 beers per day.  He was kept n.p.o. upon      admission, he was started on IV fluids as well as IV analgesics for      pain management.  With this intervention, his symptoms improved.      He was then started on a clear liquid diet which has been advanced      as tolerated.  His last lipase was within normal limits at 42.  He      is tolerating p.o. well.  He will be  discharged at this time to      follow up with his primary care physician.  He was counseled to      quit alcohol, and he is to follow up with the outpatient      psychiatric program, as he declined transfer to an inpatient      psychiatric facility.  The impression was that his pancreatitis was      secondary to the alcohol.  2. Hyponatremia with hydration, this resolved:  It was thought to be      secondary to volume depletion.  His last sodium prior to discharge      136.  3. Alcohol dependence:  The patient was started on Ativan detox      protocol upon admission.  Psychiatry was consulted for possible      transfer to inpatient psych.  Dr. Jeanie Sewer saw the  patient and he      stated that he just wanted to complete the Ativan detox protocol      here in this hospital.  Psychiatry indicated that the patient is      not committable.  He has completed the Ativan detox protocol at      this time and Dr. Jeanie Sewer recommended outpatient psychiatric care      at one of the [facility]s locally if he declined the inpatient      psychiatry admission.  Social work is to set this up and the      patient is to follow up there.  I discussed this with the patient      and he agrees to follow up outpatient for this psychiatric program.  4. Hypertension:  The patient's blood pressures were elevated on      admission and even with treating the patient's pain and with him      staying off alcohol while in the hospital, they were still      elevated.  He will be discharged on lisinopril and he is to follow      up with his primary care physician.   DISCHARGE MEDICATIONS:  1. Percocet 5/325 mg 1-2 tablets q.4-6 h p.r.n.  2. The patient to continue Xanax as previously.  3. Lisinopril 20 mg p.o. daily.  4. Patient instructed not to take medications above with alcohol.  5. The patient also to continue Prozac 40 mg p.o. daily.   FOLLOW-UP CARE:  1. Outpatient psychiatric program as above to be set up by  social      work.  2. Dr. Lupe Carney in 1-2 weeks.   DISCHARGE CONDITION:  Improved/stable.      Kela Millin, M.D.  Electronically Signed     ACV/MEDQ  D:  04/14/2008  T:  04/14/2008  Job:  [id number]   cc:   L. Lupe Carney, M.D.  Fax: [phone]

## 2010-05-27 NOTE — Op Note (Signed)
[name], SINKFIELD NO.:  [id number]   MEDICAL RECORD NO.:  [id number]          PATIENT TYPE:  INP   LOCATION:  1229                         FACILITY:  [facility]   PHYSICIAN:  Clovis Pu. Cornett, M.D.DATE OF BIRTH:  [birth year]   DATE OF PROCEDURE:  DATE OF DISCHARGE:                               OPERATIVE REPORT   PREOPERATIVE DIAGNOSIS:  Abdominal pain with free intra-abdominal air on  CT scan.   POSTOPERATIVE DIAGNOSES:  1. Abdominal pain with free intra-abdominal air on CT scan.  2. Pancreatitis.   PROCEDURE:  1. Laparoscopic cholecystectomy with cholangiogram.  2. Diagnostic laparoscopy.   SURGEON:  [name], MD   ASSISTANT:  [name], MD   ANESTHESIA:  General endotracheal anesthesia with 0.25% Sensorcaine  local.   ESTIMATED BLOOD LOSS:  100 mL.   DRAIN:  A 19 Blake drain the gallbladder fossa.   SPECIMEN:  Gallbladder, to Pathology.   INDICATIONS FOR PROCEDURE:  The patient is a 45 year old male seen today  in the emergency room with 2-day history epigastric abdominal pain,  nausea and vomiting.  CT scan showed free intra-abdominal air in his  right upper quadrant, thickening of this duodenum as well as pneumatosis  in his right colon.  He did not look horribly sick when I saw him, but  he did have tenderness and epigastric pain and I felt he warranted a  laparoscopy to further evaluate his findings.  He certainly did not  looked like somebody with a perforated viscus, but my concern was he had  a duodenal ulcer that may have perforated and sealed; also his  pneumatosis was concerning on CT and I needed to look at his colon.  I  recommended a diagnostic laparoscopy to him.  Of note, he had alcoholic  pancreatitis and a lipase of 173.  He has had no alcohol for 2 days.  I  explained the above to him and he understood the rationale for the  procedure and agreed to proceed.   DESCRIPTION OF PROCEDURE:  The patient brought to the  operating room and  placed supine.  After induction of general anesthesia, the abdomen was  prepped and draped in a sterile fashion.  A 1-cm supraumbilical incision  was made.  Dissection was carried down to his fascia.  I opened the  fascia with a scalpel and encountered a small umbilical hernia and  extended the hernia with my fascial opening.  I then placed my finger  into the abdominal cavity.  There was some turbid clear fluid, but no  succus or bile.  A pursestring suture of 0 Vicryl was placed and a 12-mm  Hasson port was placed under direct vision.  Pneumoperitoneum was  created at 15 mmHg with CO2 and a laparoscope was placed.  Upon entering  the abdominal cavity, there was a small amount of clear intra-abdominal  fluid.  There were adhesions of the omentum up to the duodenum and  gallbladder region.  Next, I decided to place two 5-mm ports, one in the  epigastrium and the second in between the umbilicus  and epigastric port.  I then took the cautery and mobilize the omentum away from the  gallbladder; it was densely adherent, but I did not see any acute  changes or any signs of acute cholecystitis.  I wanted to look at his  duodenum, since the omentum was all stuck up there, to see if he had a  duodenal perforation to explain his CT findings, which seemed the most  likely cause.  His colon was also adherent up there and I was able to  dissected it out of the omentum and pull it away from the right upper  quadrant and liver edge.  Once I did this, I identified the gallbladder;  it appeared relatively normal, but thin-walled.  The last one was used  to grab the gallbladder to pull it up and I did so, this tore a hole in  the gallbladder.  At this point, I felt that he needed cholecystectomy,  given the injury to his gallbladder and in attempt to visualize his  duodenum.  At this point, I was able to further take the omentum away  from the gallbladder to identify the infundibulum.   We were able to  dissect around the infundibulum of the gallbladder to identify the  cystic duct as the only tubular structure entering the gallbladder at  this point.  I placed clips on the gallbladder side of cystic duct and  made a small incision and through a separate stab wound, placed a Cook  cholangiogram catheter and I shot an intraoperative cholangiogram.  His  filling system appeared normal.  He had a tortuous cystic duct which  filled a common bile duct with free flow of contrast into the duodenum.  There was free flow of contrast up the common hepatic duct and into the  right and left hepatic ducts.  At this point, we went ahead and placed 4  clips and divided the cystic duct.  Cystic artery was identified; it was  controlled with clips and divided.  There was some slight swelling and  oozing.  The edema I think was associated more with the pancreatitis he  had.  We then used the cautery to dissect the gallbladder from the  gallbladder fossa at this point in time.  Gallbladder was placed in an  EndoCatch bag.  Once the gallbladder was removed, I placed Tisseel for  the oozing where the clips were on the cystic artery and cystic duct;  also, some Tisseel was placed in the gallbladder bed.  Blood and bile  were irrigated and suctioned out at this point.  I then extracted the  gallbladder and passed it off the field.  We then decided to place a  drain due to the amount of oozing and bile spillage and we placed a 19  Blake drain in the gallbladder fossa.  I then inspected the duodenum  again and saw no evidence of duodenal ulcer or perforation.  Stomach was  normal.  The transverse colon was relatively normal, except for an area  where we took the adhesions down and this showed some signs of mild  edema.  It did not look like it was a primary process and I have to  wonder if it is secondary to the pancreatitis, looking at it.  The right  colon and cecum appeared normal with no  signs of inflammatory change in  the right lower quadrant and sigmoid colon was normal.  I saw no free  fluid or pus.  Small  bowel was dilated and somewhat edematous,  consistent with an ileus.  At this point, I secured the drain with a 3-0  nylon.  Of note, liver was normal without signs of cirrhosis.  The drain  was hooked to bulb suction.  We then, after inspecting the small bowel,  large bowel, stomach, duodenum, liver, pelvis and appendiceal regions,  saw no other rationale for why he had free air at this point in time.  Her certainly did not have any evidence of pneumatosis that was  indicated on CT scan.  I felt I was satisfied with this and saw no  reason to explore any more, since I was able to see all potential areas  of perforation and these were all grossly normal.  At this point in  time, the CO2 was allowed to be released and our ports were removed.  I  used the pursestring suture to close the umbilicus.  A 4-0 Monocryl was  then used to close all skin incisions.  Steri-Strips and dry dressings  were applied.  All final counts of sponge, needle and instruments were  found to be correct at this portion of the case.  The patient was then  awoke and taken to Recovery in satisfactory condition after final counts  were found to be correct.  Gallbladder was sent to Pathology.      Thomas A. Cornett, M.D.  Electronically Signed     TAC/MEDQ  D:  08/18/2006  T:  08/19/2006  Job:  [id number]

## 2010-10-09 LAB — CBC
HCT: 45.4
Hemoglobin: 16
Hemoglobin: 16.8
MCHC: 35.2
MCV: 91.6
Platelets: 238
Platelets: 261
RBC: 4.96
RDW: 14.4
RDW: 14.2
WBC: 4.5

## 2010-10-09 LAB — DIFFERENTIAL
Basophils Absolute: 0.1
Basophils Absolute: 0.1
Basophils Relative: 1
Eosinophils Absolute: 0.2
Eosinophils Relative: 4
Lymphocytes Relative: 28
Lymphocytes Relative: 8 — ABNORMAL LOW
Lymphs Abs: 1.3
Monocytes Absolute: 0.4
Monocytes Relative: 9
Neutro Abs: 2.6
Neutro Abs: 9.6 — ABNORMAL HIGH
Neutrophils Relative %: 58
Neutrophils Relative %: 82 — ABNORMAL HIGH

## 2010-10-09 LAB — URINALYSIS, ROUTINE W REFLEX MICROSCOPIC
Bilirubin Urine: NEGATIVE
Glucose, UA: NEGATIVE
Hgb urine dipstick: NEGATIVE
Ketones, ur: NEGATIVE
Leukocytes, UA: NEGATIVE
Nitrite: NEGATIVE
Nitrite: NEGATIVE
Protein, ur: 30 — AB
Specific Gravity, Urine: 1.008
Specific Gravity, Urine: 1.029
Urobilinogen, UA: 0.2
Urobilinogen, UA: 0.2
pH: 6.5

## 2010-10-09 LAB — RAPID URINE DRUG SCREEN, HOSP PERFORMED
Amphetamines: NOT DETECTED
Barbiturates: NOT DETECTED
Benzodiazepines: NOT DETECTED
Cocaine: NOT DETECTED
Opiates: NOT DETECTED
Tetrahydrocannabinol: NOT DETECTED

## 2010-10-09 LAB — HEPATIC FUNCTION PANEL
ALT: 55 — ABNORMAL HIGH
Alkaline Phosphatase: 90
Bilirubin, Direct: 0.3
Indirect Bilirubin: 1 — ABNORMAL HIGH

## 2010-10-09 LAB — COMPREHENSIVE METABOLIC PANEL
ALT: 38
AST: 35
CO2: 26
Calcium: 8.9
Chloride: 103
GFR calc Af Amer: >60
GFR calc non Af Amer: >60
Sodium: 140

## 2010-10-09 LAB — POCT I-STAT, CHEM 8
BUN: 11
Calcium, Ion: 1.2
Glucose, Bld: 102 — ABNORMAL HIGH
HCT: 51
TCO2: 27

## 2010-10-09 LAB — ETHANOL: Alcohol, Ethyl (B): 147 — ABNORMAL HIGH

## 2010-10-09 LAB — URINE MICROSCOPIC-ADD ON: Urine-Other: NONE SEEN

## 2010-10-09 LAB — COMPREHENSIVE METABOLIC PANEL WITH GFR
Albumin: 3.7
Alkaline Phosphatase: 72
BUN: 8
Creatinine, Ser: 0.88
Glucose, Bld: 132 — ABNORMAL HIGH
Potassium: 4.3
Total Bilirubin: 0.9
Total Protein: 6.6

## 2010-10-09 LAB — LIPASE, BLOOD
Lipase: 86 — ABNORMAL HIGH
Lipase: 304 — ABNORMAL HIGH

## 2010-10-17 LAB — DIFFERENTIAL
Basophils Absolute: 0.1 10*3/uL (ref 0.0–0.1)
Lymphocytes Relative: 12 % (ref 12–46)
Neutro Abs: 7.8 10*3/uL — ABNORMAL HIGH (ref 1.7–7.7)

## 2010-10-17 LAB — URINALYSIS, ROUTINE W REFLEX MICROSCOPIC
Bilirubin Urine: NEGATIVE
Ketones, ur: NEGATIVE mg/dL
Nitrite: NEGATIVE
Protein, ur: 30 mg/dL — AB
pH: 7 (ref 5.0–8.0)

## 2010-10-17 LAB — CBC
HCT: 45.8 % (ref 39.0–52.0)
MCV: 93.8 fL (ref 78.0–100.0)
Platelets: 262 10*3/uL (ref 150–400)
RBC: 4.88 MIL/uL (ref 4.22–5.81)
WBC: 10.1 10*3/uL (ref 4.0–10.5)

## 2010-10-17 LAB — COMPREHENSIVE METABOLIC PANEL
BUN: 12 mg/dL (ref 6–23)
CO2: 27 mEq/L (ref 19–32)
Chloride: 103 mEq/L (ref 96–112)
Creatinine, Ser: 1.07 mg/dL (ref 0.4–1.5)
GFR calc non Af Amer: >60 mL/min (ref >60)
Total Bilirubin: 0.7 mg/dL (ref 0.3–1.2)

## 2010-10-17 LAB — RAPID URINE DRUG SCREEN, HOSP PERFORMED
Benzodiazepines: POSITIVE — AB
Cocaine: NOT DETECTED
Opiates: NOT DETECTED
Tetrahydrocannabinol: NOT DETECTED

## 2010-10-17 LAB — LIPASE, BLOOD: Lipase: 112 U/L — ABNORMAL HIGH (ref 11–59)

## 2010-10-27 LAB — COMPREHENSIVE METABOLIC PANEL
ALT: 112 — ABNORMAL HIGH
ALT: 267 — ABNORMAL HIGH
ALT: 92 — ABNORMAL HIGH
AST: 260 — ABNORMAL HIGH
AST: 422 — ABNORMAL HIGH
AST: 43 — ABNORMAL HIGH
AST: 66 — ABNORMAL HIGH
AST: 91 — ABNORMAL HIGH
Albumin: 2.4 — ABNORMAL LOW
Albumin: 2.6 — ABNORMAL LOW
Albumin: 3.7
Alkaline Phosphatase: 110
Alkaline Phosphatase: 55
Alkaline Phosphatase: 85
BUN: 1 — ABNORMAL LOW
BUN: 5 — ABNORMAL LOW
CO2: 27
CO2: 28
CO2: 29
Calcium: 8.5
Calcium: 8.7
Calcium: 8.8
Calcium: 9.3
Chloride: 103
Chloride: 104
Chloride: 105
Creatinine, Ser: 0.76
Creatinine, Ser: 0.78
GFR calc Af Amer: >60
GFR calc Af Amer: >60
GFR calc Af Amer: >60
GFR calc Af Amer: >60
GFR calc Af Amer: >60
GFR calc non Af Amer: >60
GFR calc non Af Amer: >60
GFR calc non Af Amer: >60
Glucose, Bld: 118 — ABNORMAL HIGH
Glucose, Bld: 151 — ABNORMAL HIGH
Potassium: 3.8
Potassium: 4.2
Potassium: 4.5
Sodium: 135
Sodium: 139
Total Bilirubin: 0.7
Total Bilirubin: 0.7
Total Bilirubin: 1.5 — ABNORMAL HIGH
Total Protein: 5.6 — ABNORMAL LOW
Total Protein: 6.1
Total Protein: 6.5

## 2010-10-27 LAB — URINE MICROSCOPIC-ADD ON

## 2010-10-27 LAB — CBC
HCT: 34.3 — ABNORMAL LOW
HCT: 38 — ABNORMAL LOW
HCT: 43.5
MCHC: 35.2
MCHC: 35.3
MCHC: 35.4
MCHC: 35.5
MCV: 93.1
MCV: 93.1
MCV: 93.5
Platelets: 154
Platelets: 161
Platelets: 215
Platelets: 268
RBC: 3.82 — ABNORMAL LOW
RDW: 11.8
RDW: 12.8
RDW: 13
RDW: 13.2

## 2010-10-27 LAB — DIFFERENTIAL
Basophils Absolute: 0
Basophils Relative: 0
Basophils Relative: 0
Basophils Relative: 0
Eosinophils Absolute: 0.1
Eosinophils Absolute: 0.2
Eosinophils Absolute: 0.3
Eosinophils Relative: 2
Eosinophils Relative: 3
Lymphocytes Relative: 11 — ABNORMAL LOW
Lymphocytes Relative: 13
Lymphs Abs: 0.5 — ABNORMAL LOW
Lymphs Abs: 0.6 — ABNORMAL LOW
Lymphs Abs: 1
Lymphs Abs: 1.1
Monocytes Absolute: 1.1 — ABNORMAL HIGH
Monocytes Relative: 12 — ABNORMAL HIGH
Monocytes Relative: 14 — ABNORMAL HIGH
Monocytes Relative: 16 — ABNORMAL HIGH
Neutro Abs: 6.3
Neutrophils Relative %: 62
Neutrophils Relative %: 71
Neutrophils Relative %: 80 — ABNORMAL HIGH
Neutrophils Relative %: 81 — ABNORMAL HIGH

## 2010-10-27 LAB — RAPID URINE DRUG SCREEN, HOSP PERFORMED: Barbiturates: NOT DETECTED

## 2010-10-27 LAB — PREALBUMIN
Prealbumin: 24.4
Prealbumin: 26.1

## 2010-10-27 LAB — CHOLESTEROL, TOTAL: Cholesterol: 146

## 2010-10-27 LAB — TRIGLYCERIDES
Triglycerides: 100
Triglycerides: 93

## 2010-10-27 LAB — AMYLASE: Amylase: 862 — ABNORMAL HIGH

## 2010-10-27 LAB — URINALYSIS, ROUTINE W REFLEX MICROSCOPIC
Nitrite: NEGATIVE
Specific Gravity, Urine: >1.046 — ABNORMAL HIGH
Urobilinogen, UA: 1

## 2010-10-27 LAB — LIPASE, BLOOD: Lipase: 286 — ABNORMAL HIGH

## 2010-10-27 LAB — MAGNESIUM: Magnesium: 2.2

## 2010-12-26 IMAGING — CR DG ABDOMEN ACUTE W/ 1V CHEST
4 series · 4 of 4 positions shown · non-contrast
Comparison: 04/11/2008

CLINICAL DATA: Pain

ACUTE ABDOMEN SERIES (ABDOMEN 2 VIEW & CHEST 1 VIEW)

[w chest pa]
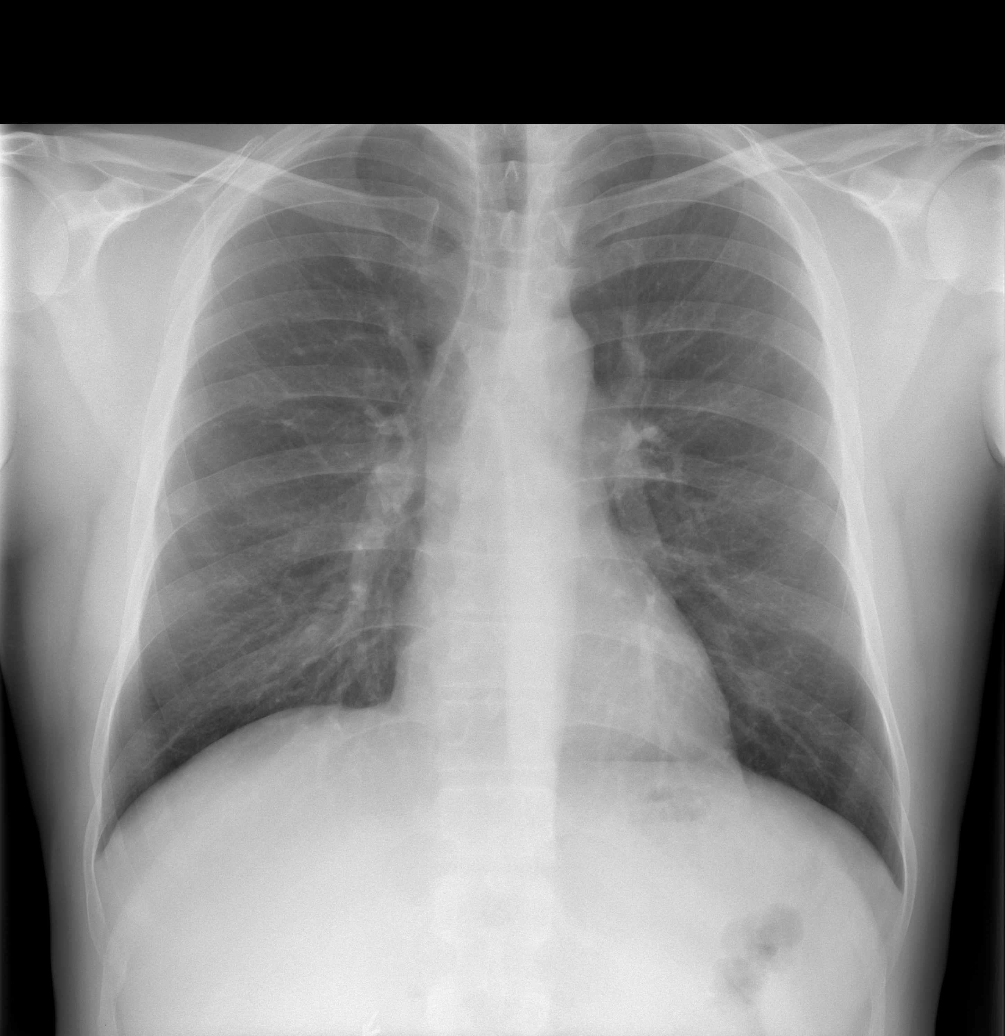

[w abdomen upright *]
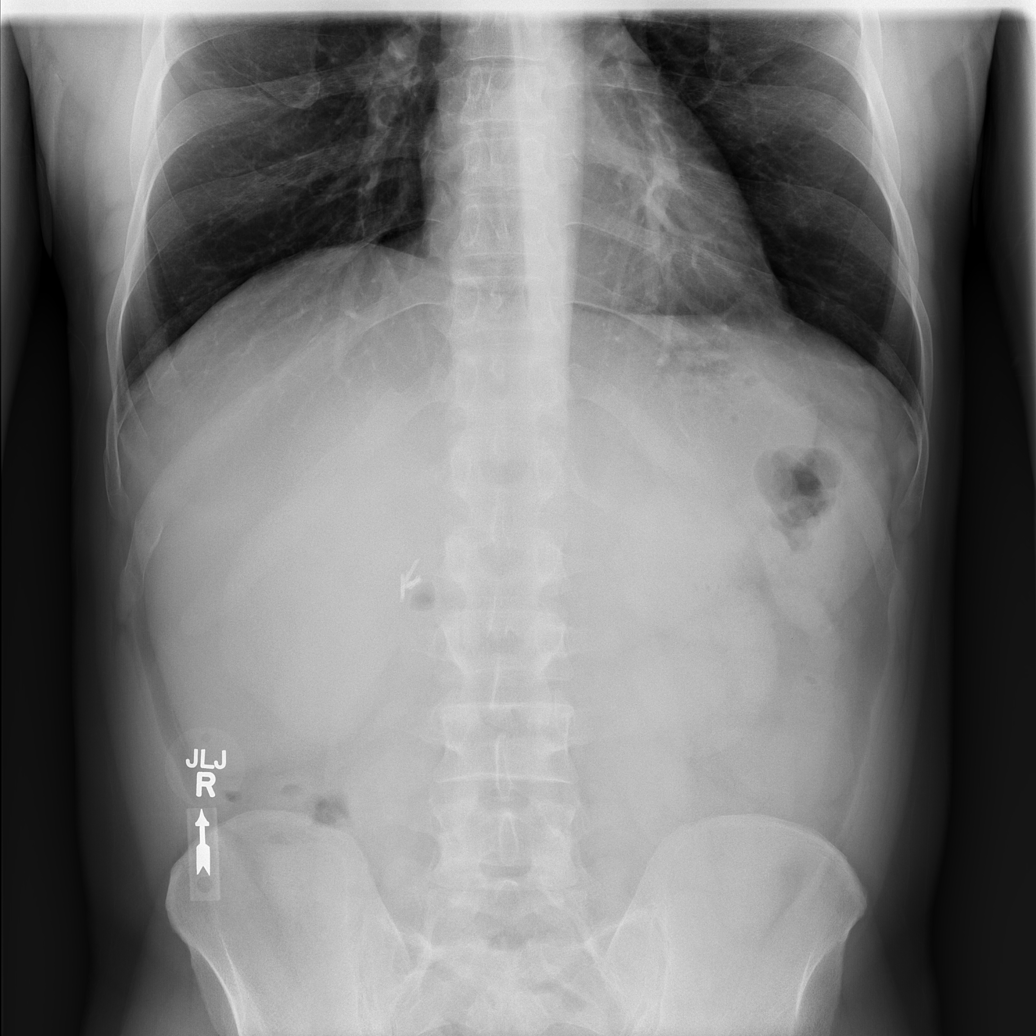

[t abdomen supine (1 of 2)]
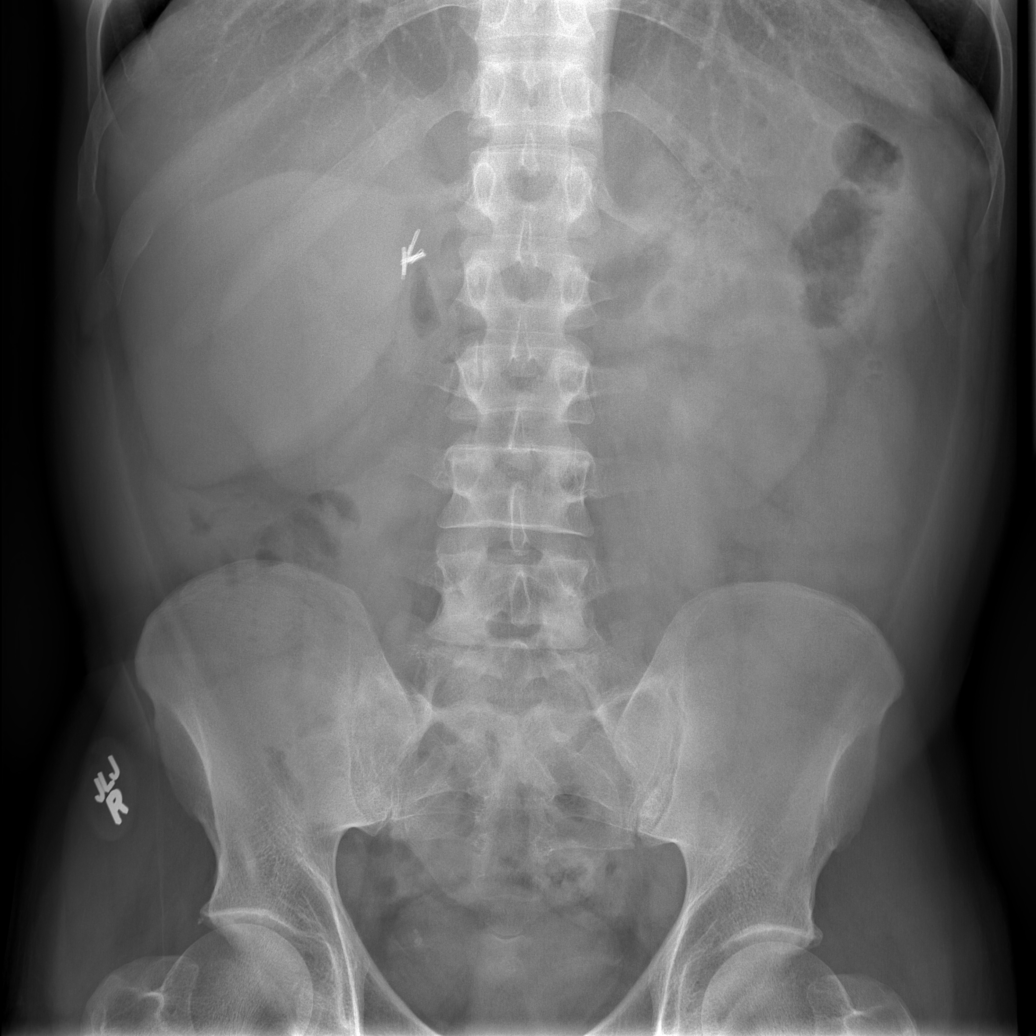

[t abdomen supine (2 of 2)]
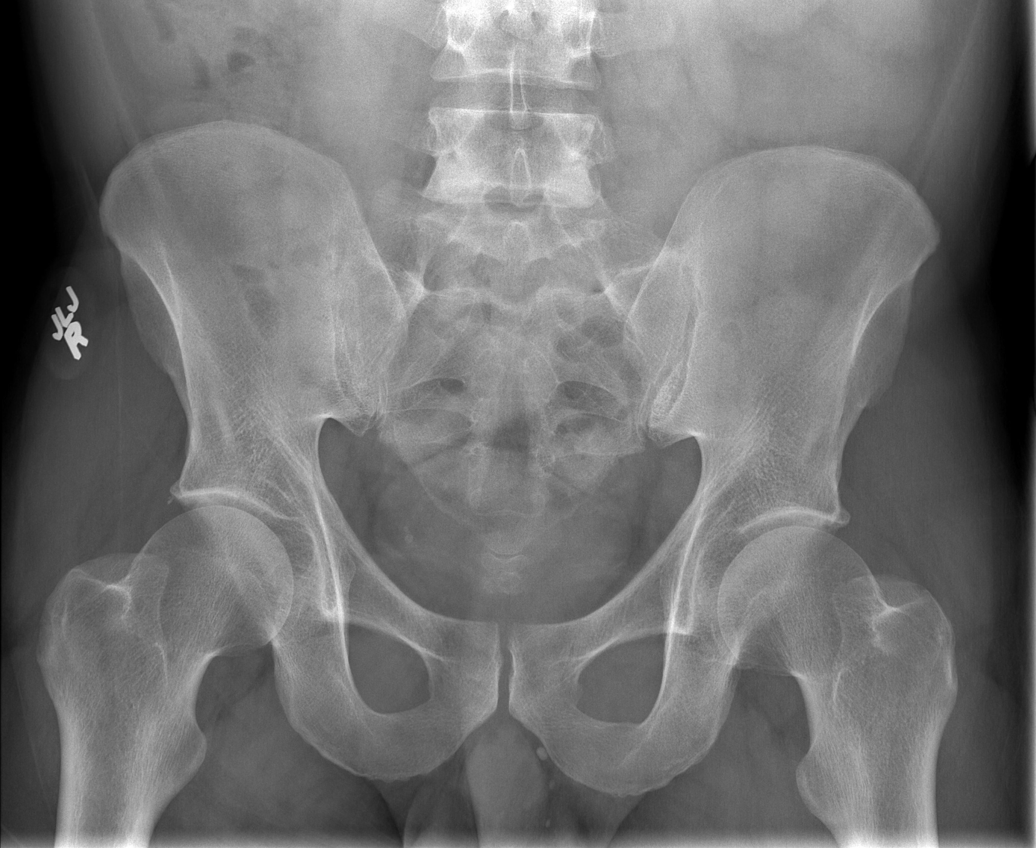

[4 of 4 positions shown; findings below may reference images not displayed]

FINDINGS: Heart size is normal.

No pleural effusions or pulmonary edema.

No airspace consolidation is identified.

Cholecystectomy clips are identified within the right upper
quadrant of the abdomen.

The bowel gas pattern appears nonobstructed.  There is no dilated
loops of the small bowel or air-fluid levels identified.

No abnormal abdominal or pelvic calcifications identified.
IMPRESSION: 1.  No active cardiopulmonary abnormalities.
2.  Nonobstructive bowel gas pattern.

## 2015-08-30 DIAGNOSIS — F419 Anxiety disorder, unspecified: Secondary | ICD-10-CM | POA: Diagnosis not present

## 2015-08-30 DIAGNOSIS — G894 Chronic pain syndrome: Secondary | ICD-10-CM | POA: Diagnosis not present

## 2015-08-30 DIAGNOSIS — I973 Postprocedural hypertension: Secondary | ICD-10-CM | POA: Diagnosis not present

## 2015-10-30 DIAGNOSIS — R197 Diarrhea, unspecified: Secondary | ICD-10-CM | POA: Diagnosis not present

## 2015-10-30 DIAGNOSIS — F419 Anxiety disorder, unspecified: Secondary | ICD-10-CM | POA: Diagnosis not present

## 2015-11-14 ENCOUNTER — Telehealth: Payer: Self-pay | Admitting: Family Medicine

## 2015-11-14 NOTE — Telephone Encounter (Signed)
Pt called in and I had the opportunity to let him know that we can see him for primary care, but we do not hand out controlled substances.    He said good that he was coming off of all that anyway.  He said his concern is for his anxiety, again I explained that the provider will want to get to the root of the anxiety and not just throw pills at it, he will need counselling or whatever the provider determines as he is taking 3 per day.  He said he understood. And that he has plenty of anxiety pills right now.  Just don't want to run out.

## 2015-11-15 ENCOUNTER — Encounter: Payer: Self-pay | Admitting: Medical

## 2015-11-15 ENCOUNTER — Telehealth: Payer: Self-pay | Admitting: Medical

## 2015-11-15 ENCOUNTER — Ambulatory Visit (INDEPENDENT_AMBULATORY_CARE_PROVIDER_SITE_OTHER): Payer: BLUE CROSS/BLUE SHIELD | Admitting: Medical

## 2015-11-15 VITALS — BP 122/90 | HR 81 | Ht 69.0 in | Wt 168.4 lb

## 2015-11-15 DIAGNOSIS — G4700 Insomnia, unspecified: Secondary | ICD-10-CM | POA: Diagnosis not present

## 2015-11-15 DIAGNOSIS — I10 Essential (primary) hypertension: Secondary | ICD-10-CM | POA: Diagnosis not present

## 2015-11-15 DIAGNOSIS — F418 Other specified anxiety disorders: Secondary | ICD-10-CM | POA: Insufficient documentation

## 2015-11-15 DIAGNOSIS — G894 Chronic pain syndrome: Secondary | ICD-10-CM | POA: Diagnosis not present

## 2015-11-15 DIAGNOSIS — J309 Allergic rhinitis, unspecified: Secondary | ICD-10-CM | POA: Diagnosis not present

## 2015-11-15 DIAGNOSIS — R41840 Attention and concentration deficit: Secondary | ICD-10-CM | POA: Diagnosis not present

## 2015-11-15 NOTE — Patient Instructions (Signed)
RESOURCES in [place], [place]  If you are experiencing a mental health crisis or an emergency, please call 911 or go to the nearest emergency department.  [facility]   [phone] [facility]  [phone] [facility]   [phone]  Suicide Hotline 1-800-Suicide 985-429-4335(1-(207)222-9679)  National Suicide Prevention Lifeline 38619629481-800-273-TALK  276-444-9952(1-2238308446)  Domestic Violence, Rape/Crisis - [facility] [phone]  [employer] Domestic Violence Hotline 1-800-799-SAFE (419) 815-6581(1-561-690-9384)  To report Child or Elder Abuse, please call: [facility] Police Department  [phone] [facility] Sherriff Department  [phone]  [facility] Crisis Line [phone]  Teen Crisis line [phone] or ([phone]     Psychiatry and Counseling services  [facility] [phone] [facility], [address], [place], [place] [address]   Dr. Andee PolesParish McKinney, psychiatry [phone]  [email or web address] [address], [address], [place], [place] [address]   [facility] [phone] office [email or web address] [address] [place], [place] [address]   Dr. Milagros Evenerupinder Kaur, psychiatry [phone] [address], [place], [place] [address]

## 2015-11-15 NOTE — Telephone Encounter (Signed)
Please call [facility] and let them know that we recommend this patient come in for evaluation.   They have been instructed to call [facility] office for an appointment.  I would like [facility] to let me know when their appointment with one of the psychiatrist and phycologist/counselors will be since I agree to help them not run of their medications provided they be seen within a reasonable time frame ASAP.   [phone]

## 2015-11-15 NOTE — Progress Notes (Signed)
Subjective: Chief Complaint  Patient presents with  . new patient    patient is feeling depressed    Here as a new patient.   Was seeing Dr. Lerry Linerwight Williams prior.  Dr. Mayford KnifeWilliams retired.    Here for discussion of depression, anxiety, possible ADD, and medications.   He notes lately not wanting to out of the house.  Doesn't drink or smoke.   Doesn't used drugs.  He notes that he shouldn't want to come home and go to bed, but that is what he is doing.   Makes excuses to his 48 yo son why he can't come home and play, but he knows its due to depression.   Is sober, but has hx/o alcohol abuse in the remote past.      He notes problems with obsession, compulsions, depressed mood, no interest in doing things, has trouble focusing on things, all over the place.  Sisters says he has ADD.    Currently taking Amitriptyline 25mg  daily, Mirtazapine 30mg  one tablet daily QHS, Klonopin 1mg  TID prescribed ,but usually only uses 1-2 Klonopin daily.   Was on SNRI in past, would like to go back on this.   In the past was on Norco for pains.   A long time ago was on Prozac, but thinks he was on about all the SSRIs through the years.     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   Commutes back and forth to [place].  Family life is not good, but its been not good for years.  girlfriend has mental health issues as well.   She stays angry.    His father was MD but passed when he was 50 years old.   Never has been to psychiatrist.    Past Medical History:  Diagnosis Date  . Anxiety   . Chronic pain   . Depression   . Hypertension     Medications: Amitriptyline 25mg  QHS Klonopin 1mg  TID Mirtazapine 30mg  QHS Losartan 50mg  daily singuliar 10mg  daily   ROS as in subjective   Objective: BP 122/90   Pulse 81   Ht 5\' 9"  (1.753 m)   Wt 168 lb 6.4 oz (76.4 kg)   SpO2 94%   BMI 24.87 kg/m   Gen: wd, wn, nad Psych: pleasant, good eye contact, answers questios appropriately    Assessment: Encounter  Diagnoses  Name Primary?  . Depression with anxiety Yes  . Insomnia, unspecified type   . Attention deficit   . Chronic pain syndrome   . Essential hypertension   . Allergic rhinitis, unspecified chronicity, unspecified seasonality, unspecified trigger      Plan: PHQ-9 questionnaire with score of 21 today. Mood disorder questionnaire today with 5 + responses.  Discussed his concerns, symptoms, prior treatments.  He has never seen psychiatry prior.   Advised that he would be best served with evaluation through psychiatry.   I recommended he call [facility] today for appt.  Gave list of psychiatric providers in the area.  Advised that I would not be able to take over these medications nor narcotics for chronic pain, but I will help keep him from running out of medications until he sees psychiatry assuming he makes every attempt to get in with psychiatry within the next 60 days for psychiatry appt, and sooner for counseling/eval appt.  He will let me know before he runs out of BP medication or other medication  F/u soon for physical

## 2015-11-19 ENCOUNTER — Other Ambulatory Visit: Payer: Self-pay | Admitting: Medical

## 2015-11-19 MED ORDER — DULOXETINE HCL 30 MG PO CPEP: 30.0000 mg | ORAL_CAPSULE | Freq: Every day | ORAL | 1 refills | Status: DC

## 2015-11-19 NOTE — Telephone Encounter (Signed)
[facility]ed [facility]  The patient has not set up and appt with them, they do have his information. Called patient to see if he was planning to set up an appt. He said that he will call and set something up. But right now he can't pay  The $100.00 up front right now. Also wants to know if he can have a rx for Cymbalta until he can get [facility].

## 2015-11-19 NOTE — Telephone Encounter (Signed)
Has he been on Cymbalta prior?   This is a good choice for both mood and chronic pain.  I sent Cymbalta 30mg  daily to pharmacy.   I do want him to go ahead and establish ASAP with [facility].   Overall, they are probably cheaper that other offices and I think they do a great job.  Have him f/u with me in 3-4 wk

## 2015-11-20 NOTE — Telephone Encounter (Signed)
Called patient to let him know that we have sent meds to pharmacy . Also he has made his appt with [facility] on 11/25/15@100pm 

## 2015-11-25 ENCOUNTER — Other Ambulatory Visit: Payer: Self-pay | Admitting: Medical

## 2015-11-25 DIAGNOSIS — F410 Panic disorder [episodic paroxysmal anxiety] without agoraphobia: Secondary | ICD-10-CM | POA: Diagnosis not present

## 2015-11-25 DIAGNOSIS — F39 Unspecified mood [affective] disorder: Secondary | ICD-10-CM | POA: Diagnosis not present

## 2015-11-25 NOTE — Telephone Encounter (Signed)
Pt called to make sure we had recd the refill request for his Hydroxyzine to [facility] [address].

## 2015-12-30 ENCOUNTER — Other Ambulatory Visit: Payer: Self-pay | Admitting: Medical

## 2016-01-16 DIAGNOSIS — F410 Panic disorder [episodic paroxysmal anxiety] without agoraphobia: Secondary | ICD-10-CM | POA: Diagnosis not present

## 2016-01-27 NOTE — Telephone Encounter (Signed)
dt ?

## 2016-03-13 ENCOUNTER — Other Ambulatory Visit: Payer: Self-pay | Admitting: Medical

## 2016-03-13 NOTE — Telephone Encounter (Signed)
Is this okay to refill? 

## 2016-03-16 ENCOUNTER — Telehealth: Payer: Self-pay | Admitting: Medical

## 2016-03-16 ENCOUNTER — Other Ambulatory Visit: Payer: Self-pay | Admitting: Medical

## 2016-03-16 NOTE — Telephone Encounter (Signed)
Left message for pt to call back and schedule Fasting CPE

## 2016-03-16 NOTE — Telephone Encounter (Signed)
Can he have a refill?

## 2016-03-16 NOTE — Telephone Encounter (Signed)
Pt called back & states he's [facility] and has an 3/16 and can't afford to come in here also at this time.  Will schedule appt soon.

## 2016-03-16 NOTE — Telephone Encounter (Signed)
Refill and get him in for CPX, fasting

## 2016-03-19 NOTE — Telephone Encounter (Signed)
thx for the FYI, but its more important he see psychiatry for followup. I think that is what your message meant.

## 2016-03-27 DIAGNOSIS — F410 Panic disorder [episodic paroxysmal anxiety] without agoraphobia: Secondary | ICD-10-CM | POA: Diagnosis not present

## 2016-04-14 ENCOUNTER — Other Ambulatory Visit: Payer: Self-pay | Admitting: Medical

## 2016-04-15 NOTE — Telephone Encounter (Signed)
Is this okay to refill? 

## 2016-05-15 ENCOUNTER — Other Ambulatory Visit: Payer: Self-pay | Admitting: Family Medicine

## 2016-05-15 NOTE — Telephone Encounter (Signed)
Is this okay to refill? 

## 2016-05-26 ENCOUNTER — Telehealth: Payer: Self-pay

## 2016-05-26 DIAGNOSIS — F410 Panic disorder [episodic paroxysmal anxiety] without agoraphobia: Secondary | ICD-10-CM | POA: Diagnosis not present

## 2016-05-26 NOTE — Telephone Encounter (Signed)
Needs f/u visit and/or physical

## 2016-05-26 NOTE — Telephone Encounter (Signed)
Fax request for refills of duloxentine to [facility] [address].

## 2016-05-29 NOTE — Telephone Encounter (Signed)
LMTCB

## 2016-06-09 ENCOUNTER — Ambulatory Visit (INDEPENDENT_AMBULATORY_CARE_PROVIDER_SITE_OTHER): Payer: BLUE CROSS/BLUE SHIELD | Admitting: Medical

## 2016-06-09 ENCOUNTER — Encounter: Payer: Self-pay | Admitting: Medical

## 2016-06-09 VITALS — BP 144/82 | HR 71 | Temp 98.4°F | Wt 173.8 lb

## 2016-06-09 DIAGNOSIS — J069 Acute upper respiratory infection, unspecified: Secondary | ICD-10-CM

## 2016-06-09 DIAGNOSIS — I159 Secondary hypertension, unspecified: Secondary | ICD-10-CM

## 2016-06-09 DIAGNOSIS — J301 Allergic rhinitis due to pollen: Secondary | ICD-10-CM

## 2016-06-09 MED ORDER — MONTELUKAST SODIUM 10 MG PO TABS: 10.0000 mg | ORAL_TABLET | Freq: Every day | ORAL | 3 refills | Status: DC

## 2016-06-09 MED ORDER — HYDROXYZINE PAMOATE 25 MG PO CAPS: 25.0000 mg | ORAL_CAPSULE | Freq: Every day | ORAL | 1 refills | Status: DC

## 2016-06-09 MED ORDER — LOSARTAN POTASSIUM-HCTZ 100-12.5 MG PO TABS: 1.0000 | ORAL_TABLET | Freq: Every day | ORAL | 0 refills | Status: DC

## 2016-06-09 NOTE — Progress Notes (Signed)
Subjective:  [name] is a 51 y.o. male who presents for respiratory illness.   He notes 4 day hx/o congestion, cough, some production to cough, but no fever, no NVD, no SOB, no sore throat, no headache.   Girlfriend has been sick with similar.  Since last visit saw psychiatrist Shanda BumpsJessica, got medications straightened out.  Doing much better on current regimen.  He notes BPs always running high.  Losartan 50mg  is still not getting BPs to goal  Asks about labs, prostate exam   Past Medical History:  Diagnosis Date  . Anxiety   . Chronic pain   . Depression   . Hypertension     ROS as in subjective   Objective: BP (!) 144/82   Pulse 71   Temp 98.4 F (36.9 C)   Wt 173 lb 12.8 oz (78.8 kg)   SpO2 98%   BMI 25.67 kg/m   General appearance: Alert, WD/WN, no distress, mildly ill appearing                             Skin: warm, no rash                           Head: no sinus tenderness                            Eyes: conjunctiva normal, corneas clear, PERRLA                            Ears: pearly TMs, external ear canals normal                          Nose: septum midline, turbinates swollen, with erythema and clear discharge             Mouth/throat: MMM, tongue normal, mild pharyngeal erythema                           Neck: supple, no adenopathy, no thyromegaly, non tender                          Heart: RRR, normal S1, S2, no murmurs                         Lungs: CTA bilaterally, no wheezes, rales, or rhonchi      Assessment  Encounter Diagnoses  Name Primary?  Marland Kitchen. Upper respiratory tract infection, unspecified type Yes  . Allergic rhinitis due to pollen, unspecified seasonality   . Secondary hypertension       Plan: URI - discussed symptoms, supportive care, c/t robitussin DM, rest, hydrate well.  If worse or not improving in the next 3-4 days, then call back  Allergic rhinitis - refilled Singulair at his request  HTN - not at goal.  Change to Losartan  HCT.  Discussed risks/benefits of medication.  F/u soon for physical  Renae Fickleaul was seen today for chest congestion.  Diagnoses and all orders for this visit:  Upper respiratory tract infection, unspecified type  Allergic rhinitis due to pollen, unspecified seasonality  Secondary hypertension  Other orders -     losartan-hydrochlorothiazide (HYZAAR) 100-12.5 MG tablet; Take 1 tablet by mouth daily. -  montelukast (SINGULAIR) 10 MG tablet; Take 1 tablet (10 mg total) by mouth at bedtime. -     hydrOXYzine (VISTARIL) 25 MG capsule; Take 1 capsule (25 mg total) by mouth at bedtime.

## 2016-06-10 ENCOUNTER — Telehealth: Payer: Self-pay | Admitting: Medical

## 2016-06-10 NOTE — Telephone Encounter (Signed)
Pt still not feeling well today. No worse & no better. Phlegm starting to come up. He did not feel like going to work. Can he get a work note to excuse him from work today?

## 2016-06-10 NOTE — Telephone Encounter (Signed)
That is fine, write note for today

## 2016-06-16 ENCOUNTER — Telehealth: Payer: Self-pay | Admitting: Family Medicine

## 2016-06-16 ENCOUNTER — Other Ambulatory Visit: Payer: Self-pay | Admitting: Medical

## 2016-06-16 MED ORDER — AZITHROMYCIN 250 MG PO TABS: ORAL_TABLET | ORAL | 0 refills | Status: DC

## 2016-06-16 NOTE — Telephone Encounter (Signed)
Pt called and states he is not getting better.  Pt say you on 06/09/2016.  He is now starting to cough up green.  [facility] [address]  Pt [phone]

## 2016-06-16 NOTE — Telephone Encounter (Signed)
zpak sent

## 2016-06-16 NOTE — Telephone Encounter (Signed)
Pt aware. /RLB  

## 2016-07-13 ENCOUNTER — Encounter: Payer: Self-pay | Admitting: Medical

## 2016-07-13 ENCOUNTER — Ambulatory Visit (INDEPENDENT_AMBULATORY_CARE_PROVIDER_SITE_OTHER): Payer: BLUE CROSS/BLUE SHIELD | Admitting: Medical

## 2016-07-13 VITALS — BP 110/78 | HR 73 | Temp 98.1°F | Wt 169.4 lb

## 2016-07-13 DIAGNOSIS — J029 Acute pharyngitis, unspecified: Secondary | ICD-10-CM

## 2016-07-13 DIAGNOSIS — J320 Chronic maxillary sinusitis: Secondary | ICD-10-CM

## 2016-07-13 DIAGNOSIS — R0982 Postnasal drip: Secondary | ICD-10-CM

## 2016-07-13 DIAGNOSIS — J342 Deviated nasal septum: Secondary | ICD-10-CM

## 2016-07-13 MED ORDER — CEFUROXIME AXETIL 500 MG PO TABS: 500.0000 mg | ORAL_TABLET | Freq: Two times a day (BID) | ORAL | 0 refills | Status: DC

## 2016-07-13 NOTE — Progress Notes (Signed)
Subjective: Chief Complaint  Patient presents with  . Sore Throat    sore throat , runny nose ,    Here for sore throat for about 4 weeks.  No cough, no ear pain, no fever, no NVD, no sinus pain.   Has hx/o GERD, but not worse lately.   I saw him about a month ago for respiratory infection, we eventually used Zpak and this got everything better but sore throat.   Does have some sinus drainage.    Does have hx/o deviated septum on right. No other aggravating or relieving factors. No other complaint.  Past Medical History:  Diagnosis Date  . Anxiety   . Chronic pain   . Depression   . Hypertension    Current Outpatient Prescriptions on File Prior to Visit  Medication Sig Dispense Refill  . amitriptyline (ELAVIL) 25 MG tablet TAKE 2 TABLETS BY MOUTH AT BEDTIME    . DULoxetine (CYMBALTA) 30 MG capsule TAKE ONE CAPSULE BY MOUTH EVERY DAY 30 capsule 0  . hydrOXYzine (VISTARIL) 25 MG capsule Take 1 capsule (25 mg total) by mouth at bedtime. 30 capsule 1  . losartan-hydrochlorothiazide (HYZAAR) 100-12.5 MG tablet Take 1 tablet by mouth daily. 90 tablet 0  . mirtazapine (REMERON) 30 MG tablet     . montelukast (SINGULAIR) 10 MG tablet Take 1 tablet (10 mg total) by mouth at bedtime. 90 tablet 3   No current facility-administered medications on file prior to visit.    ROS as in Subjective     Objective: BP 110/78   Pulse 73   Temp 98.1 F (36.7 C)   Wt 169 lb 6.4 oz (76.8 kg)   SpO2 97%   BMI 25.02 kg/m   General appearance: alert, no distress, WD/WN,  HEENT: normocephalic, sclerae anicteric, TMs pearly, right nare with deviation of septum, bilat nare with turbinated erythema and swelling, pharynx with erythema, post nasal drainage and right tonsillar erythema.   Oral cavity: MMM, no lesions Neck: supple, no lymphadenopathy, no thyromegaly Lungs: CTA bilaterally, no wheezes, rhonchi, or rales    Assessment: Encounter Diagnoses  Name Primary?  . Sore throat Yes  . Post-nasal  drainage   . Chronic maxillary sinusitis   . Deviated septum     Plan: Advised warm fluids, salt water gargles, can use OTC analgesic, begin Ceftin, use OTC antihistamine QHS for the next few weeks.  If not much improved or worse in the meantime call back.   He will let me know when he is ready for ENT consult for deviated septum. He is considering this.  Renae Fickleaul was seen today for sore throat.  Diagnoses and all orders for this visit:  Sore throat  Post-nasal drainage  Chronic maxillary sinusitis  Deviated septum  Other orders -     cefUROXime (CEFTIN) 500 MG tablet; Take 1 tablet (500 mg total) by mouth 2 (two) times daily with a meal.

## 2016-07-14 ENCOUNTER — Telehealth: Payer: Self-pay | Admitting: Medical

## 2016-07-14 NOTE — Telephone Encounter (Signed)
That is fine, note as requested

## 2016-07-14 NOTE — Telephone Encounter (Signed)
Pt is not feeling much better. He feels that the antibiotic may be helping slightly with his throat but he is nauseated and do not have an appetite. Pt was not able to go to work today and he did not go yesterday since he stayed home to come to the appointment, get meds and start it. Pt works in [place] so did not drive to [place] after appt.. Requesting out of work note for yesterday and today.

## 2016-08-13 ENCOUNTER — Ambulatory Visit (INDEPENDENT_AMBULATORY_CARE_PROVIDER_SITE_OTHER): Payer: BLUE CROSS/BLUE SHIELD | Admitting: Medical

## 2016-08-13 VITALS — BP 132/90 | HR 89 | Temp 99.0°F

## 2016-08-13 DIAGNOSIS — I10 Essential (primary) hypertension: Secondary | ICD-10-CM | POA: Diagnosis not present

## 2016-08-13 DIAGNOSIS — Z8709 Personal history of other diseases of the respiratory system: Secondary | ICD-10-CM | POA: Diagnosis not present

## 2016-08-13 DIAGNOSIS — R05 Cough: Secondary | ICD-10-CM

## 2016-08-13 DIAGNOSIS — R531 Weakness: Secondary | ICD-10-CM | POA: Diagnosis not present

## 2016-08-13 DIAGNOSIS — R059 Cough, unspecified: Secondary | ICD-10-CM

## 2016-08-13 LAB — COMPREHENSIVE METABOLIC PANEL
ALBUMIN: 4.1 g/dL (ref 3.6–5.1)
ALK PHOS: 68 U/L (ref 40–115)
ALT: 27 U/L (ref 9–46)
AST: 17 U/L (ref 10–40)
BUN: 8 mg/dL (ref 7–25)
CALCIUM: 9.2 mg/dL (ref 8.6–10.3)
CO2: 24 mmol/L (ref 20–31)
Chloride: 92 mmol/L — ABNORMAL LOW (ref 98–110)
Creat: 0.96 mg/dL (ref 0.60–1.35)
Glucose, Bld: 86 mg/dL (ref 65–99)
POTASSIUM: 4.8 mmol/L (ref 3.5–5.3)
Sodium: 128 mmol/L — ABNORMAL LOW (ref 135–146)
TOTAL PROTEIN: 6.5 g/dL (ref 6.1–8.1)
Total Bilirubin: 0.4 mg/dL (ref 0.2–1.2)

## 2016-08-13 LAB — POCT URINALYSIS DIP (PROADVANTAGE DEVICE)
BILIRUBIN UA: NEGATIVE
GLUCOSE UA: NEGATIVE mg/dL
Ketones, POC UA: NEGATIVE mg/dL
Leukocytes, UA: NEGATIVE
NITRITE UA: NEGATIVE
RBC UA: NEGATIVE
Specific Gravity, Urine: 1.01
Urobilinogen, Ur: NEGATIVE
pH, UA: 7 (ref 5.0–8.0)

## 2016-08-13 LAB — CBC WITH DIFFERENTIAL/PLATELET
BASOS PCT: 1 %
Basophils Absolute: 88 cells/uL (ref 0–200)
EOS PCT: 3 %
Eosinophils Absolute: 264 cells/uL (ref 15–500)
HCT: 40 % (ref 38.5–50.0)
HEMOGLOBIN: 14.2 g/dL (ref 13.2–17.1)
LYMPHS ABS: 792 {cells}/uL — AB (ref 850–3900)
Lymphocytes Relative: 9 %
MCH: 30 pg (ref 27.0–33.0)
MCHC: 35.5 g/dL (ref 32.0–36.0)
MCV: 84.6 fL (ref 80.0–100.0)
MPV: 9.6 fL (ref 7.5–12.5)
Monocytes Absolute: 968 cells/uL — ABNORMAL HIGH (ref 200–950)
Monocytes Relative: 11 %
NEUTROS ABS: 6688 {cells}/uL (ref 1500–7800)
NEUTROS PCT: 76 %
Platelets: 231 10*3/uL (ref 140–400)
RBC: 4.73 MIL/uL (ref 4.20–5.80)
RDW: 12.6 % (ref 11.0–15.0)
WBC: 8.8 10*3/uL (ref 4.0–10.5)

## 2016-08-13 LAB — TSH: TSH: 0.6 m[IU]/L (ref 0.40–4.50)

## 2016-08-13 NOTE — Progress Notes (Signed)
Subjective: Chief Complaint  Patient presents with  . congestion    bodyachesm coughing, congestion, chills   Here for feeling sick.  Been feeling bad this week x 3 days, coughing, congestion, chills, body aches, low back aches.    Has had low grade fever.  No nausea or vomiting.  Has had some loose stools.  Appetite down.  Girlfriend is sick with similar, he caught it from her. Feels weak and lightheaded.  Nonsmoker.  No alcohol.   He notes an asthma attack one time in past working in [employer], but no asthma concern since.  No other aggravating or relieving factors.  Compliant with medication for hypertension.   No other complaint.  Past Medical History:  Diagnosis Date  . Anxiety   . Chronic pain   . Depression   . Hypertension    Current Outpatient Prescriptions on File Prior to Visit  Medication Sig Dispense Refill  . amitriptyline (ELAVIL) 25 MG tablet TAKE 2 TABLETS BY MOUTH AT BEDTIME    . DULoxetine (CYMBALTA) 30 MG capsule TAKE ONE CAPSULE BY MOUTH EVERY DAY 30 capsule 0  . hydrOXYzine (VISTARIL) 25 MG capsule Take 1 capsule (25 mg total) by mouth at bedtime. 30 capsule 1  . losartan-hydrochlorothiazide (HYZAAR) 100-12.5 MG tablet Take 1 tablet by mouth daily. 90 tablet 0  . mirtazapine (REMERON) 30 MG tablet     . montelukast (SINGULAIR) 10 MG tablet Take 1 tablet (10 mg total) by mouth at bedtime. 90 tablet 3   No current facility-administered medications on file prior to visit.    ROS as in subjective   Objective: BP 132/90   Pulse 89   Temp 99 F (37.2 C)   SpO2 97%   Wt Readings from Last 3 Encounters:  07/13/16 169 lb 6.4 oz (76.8 kg)  06/09/16 173 lb 12.8 oz (78.8 kg)  11/15/15 168 lb 6.4 oz (76.4 kg)   BP Readings from Last 3 Encounters:  08/13/16 132/90  07/13/16 110/78  06/09/16 (!) 144/82   General appearance: alert, no distress, WD/WN, mildly ill appearing HEENT: normocephalic, sclerae anicteric, conjunctiva pink and moist, TMs pearly, nares  patent, no discharge or erythema, pharynx normal, tonsils unremarkable Oral cavity: MMM, no lesions Neck: supple, no lymphadenopathy, no thyromegaly, no masses Heart: RRR, normal S1, S2, no murmurs Lungs: somewhat coarse sounds, but no wheezes, rhonchi, or rales Pulses: 2+ symmetric Ext: no edema     Assessment: Encounter Diagnoses  Name Primary?  . Cough Yes  . Weakness   . Essential hypertension   . History of frequent upper respiratory infection     Plan: Labs and PFT today given symptoms.   PFTs normal.   discussed supportive care for viral URI.    We will call back with lab results.  Of note, urinalysis showed some protein.   Gave note for work for yesterday and today due to illness.  [name] was seen today for congestion.  Diagnoses and all orders for this visit:  Cough -     Comprehensive metabolic panel -     CBC with Differential/Platelet -     HIV antibody -     TSH  Weakness -     Comprehensive metabolic panel -     CBC with Differential/Platelet -     HIV antibody -     TSH -     POCT Urinalysis DIP (Proadvantage Device)  Essential hypertension -     Comprehensive metabolic panel -  CBC with Differential/Platelet -     HIV antibody -     TSH  History of frequent upper respiratory infection -     Comprehensive metabolic panel -     CBC with Differential/Platelet -     HIV antibody -     TSH -     Spirometry with Graph

## 2016-08-14 ENCOUNTER — Encounter: Payer: Self-pay | Admitting: Medical

## 2016-08-14 LAB — HIV ANTIBODY (ROUTINE TESTING W REFLEX): HIV: NONREACTIVE

## 2016-08-20 ENCOUNTER — Telehealth: Payer: Self-pay | Admitting: Medical

## 2016-08-20 NOTE — Telephone Encounter (Signed)
Pt called in and stated that he is still sick. He is some better. He is not as weak and no fever. He states he is still coughing but non-productive. He states he can feel it in his chest. Pt mentioned getting a cough medicine.  Please advise pt at [phone]. Pt uses [facility] on [address].

## 2016-08-20 NOTE — Telephone Encounter (Signed)
[facility] at [address], [place], [place] [id number]  if you send anything in  He would also like some cough syrup is possible

## 2016-08-20 NOTE — Telephone Encounter (Signed)
Pt called back and states that he is coughing up yellow mucous states it is bacterial and wants to know if you will send him a antibtiotic   in [facility] uses [id number] - [place], [place] - [address] pt can be reached at [phone] states he does not feel good and can not come in tomorrow he dont have any time to take off from work

## 2016-08-21 ENCOUNTER — Other Ambulatory Visit: Payer: Self-pay | Admitting: Medical

## 2016-08-21 MED ORDER — CLARITHROMYCIN 500 MG PO TABS: 500.0000 mg | ORAL_TABLET | Freq: Two times a day (BID) | ORAL | 0 refills | Status: DC

## 2016-08-21 MED ORDER — PROMETHAZINE-DM 6.25-15 MG/5ML PO SYRP: 5.0000 mL | ORAL_SOLUTION | Freq: Four times a day (QID) | ORAL | 0 refills | Status: DC | PRN

## 2016-08-21 MED ORDER — PREDNISONE 10 MG PO TABS: ORAL_TABLET | ORAL | 0 refills | Status: DC

## 2016-08-21 NOTE — Telephone Encounter (Signed)
LM on VCM that rx called to pharmacy. Trixie Rude/RLB

## 2016-08-21 NOTE — Telephone Encounter (Signed)
Pt returned call to the office. Advised need for OV if he is not feeling any better. Trixie Rude/RLB

## 2016-08-21 NOTE — Telephone Encounter (Signed)
I sent antibiotic, cough syrup, and prednisone to resolve his cough symptoms.

## 2016-08-24 ENCOUNTER — Telehealth: Payer: Self-pay | Admitting: Medical

## 2016-08-24 MED ORDER — CLARITHROMYCIN 500 MG PO TABS: 500.0000 mg | ORAL_TABLET | Freq: Two times a day (BID) | ORAL | 0 refills | Status: DC

## 2016-08-24 MED ORDER — PREDNISONE 10 MG PO TABS: ORAL_TABLET | ORAL | 0 refills | Status: DC

## 2016-08-24 MED ORDER — PROMETHAZINE-DM 6.25-15 MG/5ML PO SYRP: 5.0000 mL | ORAL_SOLUTION | Freq: Four times a day (QID) | ORAL | 0 refills | Status: DC | PRN

## 2016-08-24 NOTE — Telephone Encounter (Signed)
Cancelled scripts at [facility] and [facility] [address]

## 2016-08-24 NOTE — Telephone Encounter (Signed)
PT called to follow up on meds because he was not able to pick up meds this weekend. Biaxin, Prednisone, Promethazine [facility] not have been [facility]. Pt wants this [facility] at [facility] and [facility] [address] asap

## 2016-09-24 ENCOUNTER — Encounter: Payer: Self-pay | Admitting: Medical

## 2016-09-24 ENCOUNTER — Ambulatory Visit (INDEPENDENT_AMBULATORY_CARE_PROVIDER_SITE_OTHER): Payer: BLUE CROSS/BLUE SHIELD | Admitting: Medical

## 2016-09-24 VITALS — BP 128/76 | HR 71 | Temp 98.0°F | Wt 171.6 lb

## 2016-09-24 DIAGNOSIS — R112 Nausea with vomiting, unspecified: Secondary | ICD-10-CM

## 2016-09-24 DIAGNOSIS — R197 Diarrhea, unspecified: Secondary | ICD-10-CM | POA: Diagnosis not present

## 2016-09-24 DIAGNOSIS — K529 Noninfective gastroenteritis and colitis, unspecified: Secondary | ICD-10-CM | POA: Diagnosis not present

## 2016-09-24 NOTE — Progress Notes (Signed)
Subjective: Chief Complaint  Patient presents with  . Nausea    nausea, diarrhea x3 days no fever   Here for nausea, diarrhea, vomiting.  Started 3 days ago with nausea, didn't fell well.  Later in the day started  Having diarrhea, vomiting.   vomited 3 times and 1 diarrhea stool on the first day.  Had about the same amount of vomiting and diarrhea yesterday.  No URI symptoms.  Today so far had a more solid BM today.   No vomiting today.   Hasn't eaten anything today.  Taking imodium 3 tablets the first day, drinking a lot of water.  No fever.   No urinary c/o.   Girlfriend had similar symptoms several days ago , but not sure about diarrhea.   He denies alcohol or smoking.     No recent travel, no camping, no new animal exposure.    Ate some potato salad 4 days ago.   Wonders about food poisoning with potato salad as it was in his refrigerated a whole week.   Did eat some medium raw port this past week.    Has some depressed mood,  [facility]ica [facility].  Has questions about medications for anxiety.    No other aggravating or relieving factors. No other complaint.  Past Medical History:  Diagnosis Date  . Anxiety   . Chronic pain   . Depression   . Hypertension    Current Outpatient Prescriptions on File Prior to Visit  Medication Sig Dispense Refill  . DULoxetine (CYMBALTA) 30 MG capsule TAKE ONE CAPSULE BY MOUTH EVERY DAY 30 capsule 0  . hydrOXYzine (VISTARIL) 25 MG capsule Take 1 capsule (25 mg total) by mouth at bedtime. 30 capsule 1  . losartan-hydrochlorothiazide (HYZAAR) 100-12.5 MG tablet Take 1 tablet by mouth daily. 90 tablet 0  . mirtazapine (REMERON) 30 MG tablet     . montelukast (SINGULAIR) 10 MG tablet Take 1 tablet (10 mg total) by mouth at bedtime. 90 tablet 3  . amitriptyline (ELAVIL) 25 MG tablet TAKE 2 TABLETS BY MOUTH AT BEDTIME     No current facility-administered medications on file prior to visit.    ROS as in  subjective   Objective: BP 128/76   Pulse 71   Temp 98 F (36.7 C)   Wt 171 lb 9.6 oz (77.8 kg)   SpO2 98%   BMI 25.34 kg/m   General appearance: alert, no distress, WD/WN,  HE ENT: normocephalic, sclerae anicteric, TMs pearly, nares patent, no discharge or erythema, pharynx normal Oral cavity: MMM, no lesions Neck: supple, no lymphadenopathy, no thyromegaly, no masses Heart: RRR, normal S1, S2, no murmurs Lungs: CTA bilaterally, no wheezes, rhonchi, or rales Abdomen: +bs, soft, non tender, non distended, no masses, no hepatomegaly, no splenomegaly Pulses: 2+ symmetric, upper and lower extremities, normal cap refill    Assessment: Encounter Diagnoses  Name Primary?  . Acute gastroenteritis Yes  . Nausea and vomiting, intractability of vomiting not specified, unspecified vomiting type   . Diarrhea, unspecified type     Plan: symptoms are mostly resolving, clinically looks fine.   Discussed supportive care, rest, hydration, avoid spicy and fried foods the next few days, can use BRAT diet.  Discussed proper use and risks/benefits of Imodium.    Call or return if worse or not continuing to improve in the next 48 hours.   Note given for work.

## 2016-09-28 ENCOUNTER — Telehealth: Payer: Self-pay | Admitting: Family Medicine

## 2016-09-28 NOTE — Telephone Encounter (Signed)
[facility] req refill of Hydroxyzine Pamoate 25 mg   #30

## 2016-09-29 ENCOUNTER — Other Ambulatory Visit: Payer: Self-pay | Admitting: Medical

## 2016-09-29 MED ORDER — HYDROXYZINE PAMOATE 25 MG PO CAPS
25.0000 mg | ORAL_CAPSULE | Freq: Every day | ORAL | 1 refills | Status: DC
Start: 2016-09-29 — End: 2016-11-27

## 2016-10-28 ENCOUNTER — Other Ambulatory Visit: Payer: Self-pay | Admitting: Medical

## 2016-11-27 ENCOUNTER — Other Ambulatory Visit: Payer: Self-pay | Admitting: Medical

## 2016-12-01 ENCOUNTER — Other Ambulatory Visit: Payer: Self-pay | Admitting: Medical

## 2016-12-02 ENCOUNTER — Other Ambulatory Visit: Payer: Self-pay

## 2016-12-02 ENCOUNTER — Telehealth: Payer: Self-pay | Admitting: Medical

## 2016-12-02 ENCOUNTER — Other Ambulatory Visit: Payer: Self-pay | Admitting: Medical

## 2016-12-02 MED ORDER — LOSARTAN POTASSIUM-HCTZ 100-12.5 MG PO TABS: 1.0000 | ORAL_TABLET | Freq: Every day | ORAL | 1 refills | Status: DC

## 2016-12-02 NOTE — Telephone Encounter (Signed)
Called pt to set up and appt to sent in refills for meds.  pt  Protiodide to someone else . refused pt medicines

## 2016-12-02 NOTE — Telephone Encounter (Signed)
Pt called requesting a refill on his losartin-hydrochlorothiazide pt uses [phone] - [place], [place] - [address] AT [facility] [address] pt can be reached [phone]

## 2017-01-25 ENCOUNTER — Encounter: Payer: BLUE CROSS/BLUE SHIELD | Admitting: Medical

## 2017-01-26 ENCOUNTER — Other Ambulatory Visit: Payer: Self-pay | Admitting: Medical

## 2017-01-26 NOTE — Telephone Encounter (Signed)
Called and l/m for pt call us back to make an appt for med check appt.

## 2017-01-27 ENCOUNTER — Other Ambulatory Visit: Payer: Self-pay

## 2017-01-27 ENCOUNTER — Telehealth: Payer: Self-pay | Admitting: Medical

## 2017-01-27 MED ORDER — LOSARTAN POTASSIUM-HCTZ 100-12.5 MG PO TABS: 1.0000 | ORAL_TABLET | Freq: Every day | ORAL | 1 refills | Status: DC

## 2017-01-27 NOTE — Telephone Encounter (Signed)
Sent 30 day supply to pharmacy.

## 2017-01-27 NOTE — Telephone Encounter (Signed)
Pt called and made a medcheck appt for feb. Pt needs refills on Losartan-hctz. [facility] [address].

## 2017-02-09 ENCOUNTER — Other Ambulatory Visit: Payer: Self-pay

## 2017-02-09 ENCOUNTER — Telehealth: Payer: Self-pay | Admitting: Medical

## 2017-02-09 MED ORDER — MONTELUKAST SODIUM 10 MG PO TABS: 10.0000 mg | ORAL_TABLET | Freq: Every day | ORAL | 3 refills | Status: DC

## 2017-02-09 NOTE — Telephone Encounter (Signed)
Sent refill to pharmacy. 

## 2017-02-09 NOTE — Telephone Encounter (Signed)
Pt called and needs a refill on his singuliar pt uses [phone] - [place], [place] - [address] AT [facility] [address] & pt can be reached at [phone]

## 2017-02-19 ENCOUNTER — Ambulatory Visit (INDEPENDENT_AMBULATORY_CARE_PROVIDER_SITE_OTHER): Payer: BLUE CROSS/BLUE SHIELD | Admitting: Medical

## 2017-02-19 ENCOUNTER — Encounter: Payer: Self-pay | Admitting: Medical

## 2017-02-19 VITALS — BP 118/80 | HR 73 | Wt 175.6 lb

## 2017-02-19 DIAGNOSIS — Z7189 Other specified counseling: Secondary | ICD-10-CM | POA: Diagnosis not present

## 2017-02-19 DIAGNOSIS — J309 Allergic rhinitis, unspecified: Secondary | ICD-10-CM | POA: Diagnosis not present

## 2017-02-19 DIAGNOSIS — E871 Hypo-osmolality and hyponatremia: Secondary | ICD-10-CM | POA: Diagnosis not present

## 2017-02-19 DIAGNOSIS — G4700 Insomnia, unspecified: Secondary | ICD-10-CM | POA: Diagnosis not present

## 2017-02-19 DIAGNOSIS — Z1211 Encounter for screening for malignant neoplasm of colon: Secondary | ICD-10-CM | POA: Diagnosis not present

## 2017-02-19 DIAGNOSIS — Z125 Encounter for screening for malignant neoplasm of prostate: Secondary | ICD-10-CM | POA: Diagnosis not present

## 2017-02-19 DIAGNOSIS — R41840 Attention and concentration deficit: Secondary | ICD-10-CM | POA: Diagnosis not present

## 2017-02-19 DIAGNOSIS — F418 Other specified anxiety disorders: Secondary | ICD-10-CM | POA: Diagnosis not present

## 2017-02-19 DIAGNOSIS — Z1271 Encounter for screening for malignant neoplasm of testis: Secondary | ICD-10-CM | POA: Diagnosis not present

## 2017-02-19 DIAGNOSIS — I10 Essential (primary) hypertension: Secondary | ICD-10-CM

## 2017-02-19 DIAGNOSIS — Z2821 Immunization not carried out because of patient refusal: Secondary | ICD-10-CM | POA: Diagnosis not present

## 2017-02-19 DIAGNOSIS — G894 Chronic pain syndrome: Secondary | ICD-10-CM | POA: Diagnosis not present

## 2017-02-19 DIAGNOSIS — Z0000 Encounter for general adult medical examination without abnormal findings: Secondary | ICD-10-CM

## 2017-02-19 DIAGNOSIS — Z7185 Encounter for immunization safety counseling: Secondary | ICD-10-CM

## 2017-02-19 NOTE — Patient Instructions (Signed)

## 2017-02-19 NOTE — Assessment & Plan Note (Signed)
Continue routine follow up with psychiatry

## 2017-02-19 NOTE — Assessment & Plan Note (Signed)
Discussed prostate cancer screening, risks/benefits.

## 2017-02-19 NOTE — Assessment & Plan Note (Signed)
Recheck labs today.   Prior found on labs thought to be related to medication adverse effect

## 2017-02-19 NOTE — Assessment & Plan Note (Signed)
Due age 52 next year

## 2017-02-19 NOTE — Assessment & Plan Note (Signed)
Discussed sleep hygiene measures including regular sleep schedule, optimal sleep environment, and relaxing presleep rituals.  Recommended daily exercise.  Discussed stimulus control, avoiding daytime naps, avoiding caffeine after noon, avoiding excess alcohol, avoiding food or drink <2 hours before bedtime.    

## 2017-02-19 NOTE — Assessment & Plan Note (Signed)
They decline the flu vaccine today despite being counseled on this. 

## 2017-02-19 NOTE — Assessment & Plan Note (Signed)
Counseled on Tdap.  He will consider but declines today.

## 2017-02-19 NOTE — Progress Notes (Signed)
Subjective:   HPI  [name] is a 52 y.o. male who presents for physical Chief Complaint  Patient presents with  . Annual Exam   Medical care team includes: Tranika Scholler, Kermit Baloavid S, PA-C here for primary care Getting ready to [facility] a [facility].   Concerns: none  Reviewed their medical, surgical, family, social, medication, and allergy history and updated chart as appropriate.  Past Medical History:  Diagnosis Date  . Anxiety   . Chronic pain   . Depression   . History of alcohol abuse    prior to 2010  . Hypertension   . Insomnia     Past Surgical History:  Procedure Laterality Date  . CHOLECYSTECTOMY      Social History   Socioeconomic History  . Marital status: Single    Spouse name: Not on file  . Number of children: Not on file  . Years of education: Not on file  . Highest education level: Not on file  Social Needs  . Financial resource strain: Not on file  . Food insecurity - worry: Not on file  . Food insecurity - inability: Not on file  . Transportation needs - medical: Not on file  . Transportation needs - non-medical: Not on file  Occupational History  . Not on file  Tobacco Use  . Smoking status: Never Smoker  . Smokeless tobacco: Never Used  Substance and Sexual Activity  . Alcohol use: No  . Drug use: No  . Sexual activity: Not on file  Other Topics Concern  . Not on file  Social History Narrative   Lives with girlfriend, 613 yo son.   Not exercising.   Works on [employer] for [employer].    02/2017.    Family History  Problem Relation Age of Onset  . Cancer Mother        breast and bone  . Other Father        died age 10262yo when Renae Fickleaul was 52yo  . Diabetes Sister   . Bipolar disorder Sister   . Heart disease Neg Hx   . Stroke Neg Hx   . Hypertension Neg Hx      Current Outpatient Medications:  .  DULoxetine (CYMBALTA) 30 MG capsule, TAKE ONE CAPSULE BY MOUTH EVERY DAY, Disp: 30 capsule, Rfl:  0 .  hydrOXYzine (VISTARIL) 25 MG capsule, TAKE 1 CAPSULE(25 MG) BY MOUTH AT BEDTIME, Disp: 30 capsule, Rfl: 0 .  losartan-hydrochlorothiazide (HYZAAR) 100-12.5 MG tablet, Take 1 tablet by mouth daily., Disp: 30 tablet, Rfl: 1 .  mirtazapine (REMERON) 30 MG tablet, , Disp: , Rfl:  .  montelukast (SINGULAIR) 10 MG tablet, Take 1 tablet (10 mg total) by mouth at bedtime., Disp: 90 tablet, Rfl: 3 .  QUEtiapine (SEROQUEL) 25 MG tablet, Take 25 mg by mouth at bedtime., Disp: , Rfl:  .  amitriptyline (ELAVIL) 25 MG tablet, TAKE 2 TABLETS BY MOUTH AT BEDTIME, Disp: , Rfl:   Allergies  Allergen Reactions  . Benadryl [Diphenhydramine]     Worsens depression  . Doxycycline     Made him feel horrible  . Penicillins     rash  . Sulfa Antibiotics     Review of Systems Constitutional: -fever, -chills, -sweats, -unexpected weight change, -decreased appetite, -fatigue Allergy: -sneezing, -itching, -congestion Dermatology: -changing moles, --rash, -lumps ENT: -runny nose, -ear pain, -sore throat, -hoarseness, -sinus pain, -teeth pain, - ringing in ears, -hearing loss, -nosebleeds Cardiology: -chest pain, -palpitations, -swelling, -difficulty  breathing when lying flat, -waking up short of breath Respiratory: -cough, -shortness of breath, -difficulty breathing with exercise or exertion, -wheezing, -coughing up blood Gastroenterology: -abdominal pain, -nausea, -vomiting, -diarrhea, -constipation, -blood in stool, -changes in bowel movement, -difficulty swallowing or eating Hematology: -bleeding, -bruising  Musculoskeletal: -joint aches, -muscle aches, -joint swelling, -back pain, -neck pain, -cramping, -changes in gait Ophthalmology: denies vision changes, eye redness, itching, discharge Urology: -burning with urination, -difficulty urinating, -blood in urine, -urinary frequency, -urgency, -incontinence Neurology: -headache, -weakness, -tingling, -numbness, -memory loss, -falls, -dizziness Psychology:  -depressed mood, -agitation, +sleep problems     Objective:   BP 118/80   Pulse 73   Wt 175 lb 9.6 oz (79.7 kg)   SpO2 99%   BMI 25.93 kg/m   General appearance: alert, no distress, WD/WN, Caucasian male Skin: scattered macules, right preauricular area with small 2mm papular flesh colored lesion, no worrisome lesions HEENT: normocephalic, conjunctiva/corneas normal, sclerae anicteric, PERRLA, EOMi, nares patent, no discharge or erythema, pharynx normal Oral cavity: MMM, tongue normal, teeth in good repair Neck: supple, no lymphadenopathy, no thyromegaly, no masses, normal ROM, no bruits Chest: non tender, normal shape and expansion Heart: RRR, normal S1, S2, no murmurs Lungs: CTA bilaterally, no wheezes, rhonchi, or rales Abdomen: +bs, soft, non tender, non distended, no masses, no hepatomegaly, no splenomegaly, no bruits Back: non tender, normal ROM, no scoliosis Musculoskeletal: upper extremities non tender, no obvious deformity, normal ROM throughout, lower extremities non tender, no obvious deformity, normal ROM throughout Extremities: no edema, no cyanosis, no clubbing Pulses: 2+ symmetric, upper and lower extremities, normal cap refill Neurological: alert, oriented x 3, CN2-12 intact, strength normal upper extremities and lower extremities, sensation normal throughout, DTRs 2+ throughout, no cerebellar signs, gait normal Psychiatric: normal affect, behavior normal, pleasant  GU: normal male external genitalia,circumcised, nontender, no masses, no hernia, no lymphadenopathy Rectal: declined   Adult ECG Report  Indication: physical  Rate: 59 bpm  Rhythm: sinus bradycardia  QRS Axis: 16 degrees  PR Interval: 146 bpm  QRS Duration: 86 ms  QTc:  Conduction Disturbances: none  Other Abnormalities: none  Patient's cardiac risk factors are: hypertension and male gender.  EKG comparison: none  Narrative Interpretation: sinus bradycardia     Assessment and Plan :     Encounter Diagnoses  Name Primary?  . Encounter for health maintenance examination in adult Yes  . Essential hypertension   . Allergic rhinitis, unspecified seasonality, unspecified trigger   . Depression with anxiety   . Attention deficit   . Insomnia, unspecified type   . Chronic pain syndrome   . Screening for testicular cancer   . Screen for colon cancer   . Vaccine counseling   . Influenza vaccination declined   . Hyponatremia   . Screening for prostate cancer     Encounter for health maintenance examination in adult Discussed and counseled on healthy lifestyle, diet, exercise, preventative care, vaccinations, sick and well care, proper use of emergency dept and after hours care, and addressed their concerns.  They were advised to see their eye doctor and dentist yearly.  Depression with anxiety Continue routine follow up with psychiatry  Allergic rhinitis Avoid triggers, refilled medication  Influenza vaccination declined They decline the flu vaccine today despite being counseled on this.  Hyponatremia Recheck labs today.   Prior found on labs thought to be related to medication adverse effect  Screening for prostate cancer Discussed prostate cancer screening, risks/benefits.    Screen for colon cancer Due  age 70 next year  Vaccine counseling Counseled on Tdap.  He will consider but declines today.    Vencil was seen today for annual exam.  Diagnoses and all orders for this visit:  Encounter for health maintenance examination in adult -     CBC with Differential/Platelet -     Comprehensive metabolic panel -     Lipid panel -     TSH -     PSA -     EKG 12-Lead  Essential hypertension -     EKG 12-Lead  Allergic rhinitis, unspecified seasonality, unspecified trigger  Depression with anxiety  Attention deficit  Insomnia, unspecified type  Chronic pain syndrome  Screening for testicular cancer  Screen for colon cancer  Vaccine  counseling  Influenza vaccination declined  Hyponatremia  Screening for prostate cancer -     PSA   Follow-up pending labs, yearly for physical

## 2017-02-19 NOTE — Assessment & Plan Note (Signed)
Avoid triggers, refilled medication

## 2017-02-19 NOTE — Assessment & Plan Note (Signed)
Discussed and counseled on healthy lifestyle, diet, exercise, preventative care, vaccinations, sick and well care, proper use of emergency dept and after hours care, and addressed their concerns.  They were advised to see their eye doctor and dentist yearly. 

## 2017-02-20 LAB — COMPREHENSIVE METABOLIC PANEL
A/G RATIO: 2 (ref 1.2–2.2)
ALT: 19 IU/L (ref 0–44)
AST: 11 IU/L (ref 0–40)
Albumin: 4.5 g/dL (ref 3.5–5.5)
Alkaline Phosphatase: 57 IU/L (ref 39–117)
BUN/Creatinine Ratio: 19 (ref 9–20)
BUN: 20 mg/dL (ref 6–24)
Bilirubin Total: 0.3 mg/dL (ref 0.0–1.2)
CALCIUM: 9.9 mg/dL (ref 8.7–10.2)
CO2: 22 mmol/L (ref 20–29)
CREATININE: 1.04 mg/dL (ref 0.76–1.27)
Chloride: 102 mmol/L (ref 96–106)
GFR, EST AFRICAN AMERICAN: 97 mL/min/{1.73_m2} (ref >59)
GFR, EST NON AFRICAN AMERICAN: 84 mL/min/{1.73_m2} (ref >59)
GLUCOSE: 100 mg/dL — AB (ref 65–99)
Globulin, Total: 2.2 g/dL (ref 1.5–4.5)
POTASSIUM: 5.1 mmol/L (ref 3.5–5.2)
Sodium: 141 mmol/L (ref 134–144)
TOTAL PROTEIN: 6.7 g/dL (ref 6.0–8.5)

## 2017-02-20 LAB — CBC WITH DIFFERENTIAL/PLATELET
BASOS ABS: 0.1 10*3/uL (ref 0.0–0.2)
BASOS: 2 %
EOS (ABSOLUTE): 0.1 10*3/uL (ref 0.0–0.4)
Eos: 3 %
Hematocrit: 43.1 % (ref 37.5–51.0)
Hemoglobin: 14.4 g/dL (ref 13.0–17.7)
IMMATURE GRANS (ABS): 0 10*3/uL (ref 0.0–0.1)
IMMATURE GRANULOCYTES: 0 %
LYMPHS: 27 %
Lymphocytes Absolute: 1.1 10*3/uL (ref 0.7–3.1)
MCH: 29.4 pg (ref 26.6–33.0)
MCHC: 33.4 g/dL (ref 31.5–35.7)
MCV: 88 fL (ref 79–97)
MONOS ABS: 0.5 10*3/uL (ref 0.1–0.9)
Monocytes: 12 %
NEUTROS PCT: 56 %
Neutrophils Absolute: 2.3 10*3/uL (ref 1.4–7.0)
PLATELETS: 252 10*3/uL (ref 150–379)
RBC: 4.89 x10E6/uL (ref 4.14–5.80)
RDW: 14 % (ref 12.3–15.4)
WBC: 4.2 10*3/uL (ref 3.4–10.8)

## 2017-02-20 LAB — LIPID PANEL
CHOL/HDL RATIO: 5.5 ratio — AB (ref 0.0–5.0)
Cholesterol, Total: 191 mg/dL (ref 100–199)
HDL: 35 mg/dL — AB (ref >39)
LDL Calculated: 136 mg/dL — ABNORMAL HIGH (ref 0–99)
TRIGLYCERIDES: 102 mg/dL (ref 0–149)
VLDL CHOLESTEROL CAL: 20 mg/dL (ref 5–40)

## 2017-02-20 LAB — PSA: Prostate Specific Ag, Serum: 0.9 ng/mL (ref 0.0–4.0)

## 2017-02-20 LAB — TSH: TSH: 1.01 u[IU]/mL (ref 0.450–4.500)

## 2017-02-22 ENCOUNTER — Encounter: Payer: Self-pay | Admitting: Medical

## 2017-02-22 ENCOUNTER — Other Ambulatory Visit: Payer: Self-pay | Admitting: Medical

## 2017-02-22 MED ORDER — LOSARTAN POTASSIUM-HCTZ 100-12.5 MG PO TABS: 1.0000 | ORAL_TABLET | Freq: Every day | ORAL | 3 refills | Status: DC

## 2017-02-25 ENCOUNTER — Other Ambulatory Visit: Payer: Self-pay | Admitting: Medical

## 2017-02-25 MED ORDER — QUETIAPINE FUMARATE 50 MG PO TABS: 50.0000 mg | ORAL_TABLET | Freq: Every day | ORAL | 2 refills | Status: DC

## 2017-02-25 NOTE — Addendum Note (Signed)
Addended by: Jac CanavanYSINGER, Oliviah Agostini S on: 02/25/2017 06:22 PM   Modules accepted: Orders

## 2017-02-25 NOTE — Telephone Encounter (Signed)
Pt called and said that he is having trouble sleeping at night that you and him had discuss this at his physical appt,he wants to know if he can have some thing to help sleep at night?

## 2017-02-25 NOTE — Telephone Encounter (Signed)
  According to the nurse med reconciliation when he was here last week he is on the following medication:  Cymbalta 30 mg daily Seroquel 25 mg daily at bedtime Singulair 10 mg daily at bedtime Remeron 30 mg daily at bedtime Losartan HCT for blood pressure in the morning  and hydroxyzine as needed   I increased his Seroquel to 50 mg at nighttime doubling that dose.  Keep in mind he is also taking Remeron which is a sleep aid.    He is supposed to be seeing a psychiatrist soon,  and he can certainly discuss with  the psychiatrist  other recommendations.  If he has never had a sleep study is probably time to do this.  Please ask?  Lets see how he does on the increased dose of Seroquel.   Call report 2 wk

## 2017-02-26 NOTE — Telephone Encounter (Signed)
Called and l/m for pt call him back

## 2017-02-26 NOTE — Telephone Encounter (Signed)
Pt called back to say that he has already doubled the seroquel before and felt like a zombie the next day. Pt also said he has not had a sleep study and will let his psychiatrist know about the lack of sleep. Thanks [employer]

## 2017-02-26 NOTE — Telephone Encounter (Signed)
At this point I recommend either referral for sleep study or c/t current medications and see psychiatry to discuss further since he is already on sedating medications

## 2017-03-01 NOTE — Telephone Encounter (Signed)
Called and l/m for pt call us back.  

## 2017-03-08 ENCOUNTER — Encounter: Payer: Self-pay | Admitting: Medical

## 2017-03-08 ENCOUNTER — Ambulatory Visit (INDEPENDENT_AMBULATORY_CARE_PROVIDER_SITE_OTHER): Payer: BLUE CROSS/BLUE SHIELD | Admitting: Medical

## 2017-03-08 VITALS — BP 120/76 | HR 75 | Wt 178.8 lb

## 2017-03-08 DIAGNOSIS — M545 Low back pain, unspecified: Secondary | ICD-10-CM

## 2017-03-08 MED ORDER — NAPROXEN 500 MG PO TABS: 500.0000 mg | ORAL_TABLET | Freq: Two times a day (BID) | ORAL | 0 refills | Status: DC

## 2017-03-08 MED ORDER — CYCLOBENZAPRINE HCL 10 MG PO TABS: ORAL_TABLET | ORAL | 1 refills | Status: DC

## 2017-03-08 NOTE — Progress Notes (Signed)
Subjective: Chief Complaint  Patient presents with  . other    lower back pain for three week no injury   Here for low back pain.   Worse on the right, hurts to even put underwear on.  He notes worse pain in last 3 weeks.  Denies fall, injury, trauma.   No fever, no blood in stool or urine.    No abdominal pain.   No radicular pain in legs.   No numbness or tingling in legs.   Does stretching some in the shower in morning.   Exercise - nothing.  Advil helps some.  He is moving to [place] and in the meantime commuting at least 4 hours daily.  No other aggravating or relieving factors. No other complaint.  Past Medical History:  Diagnosis Date  . Anxiety   . Chronic pain   . Depression   . History of alcohol abuse    prior to 2010  . Hypertension   . Insomnia    Current Outpatient Medications on File Prior to Visit  Medication Sig Dispense Refill  . DULoxetine (CYMBALTA) 30 MG capsule TAKE ONE CAPSULE BY MOUTH EVERY DAY 30 capsule 0  . hydrOXYzine (VISTARIL) 25 MG capsule TAKE 1 CAPSULE(25 MG) BY MOUTH AT BEDTIME 30 capsule 0  . losartan-hydrochlorothiazide (HYZAAR) 100-12.5 MG tablet Take 1 tablet by mouth daily. 90 tablet 3  . mirtazapine (REMERON) 30 MG tablet     . montelukast (SINGULAIR) 10 MG tablet Take 1 tablet (10 mg total) by mouth at bedtime. 90 tablet 3  . QUEtiapine (SEROQUEL) 50 MG tablet Take 1 tablet (50 mg total) by mouth at bedtime. 30 tablet 2   No current facility-administered medications on file prior to visit.    ROS as in subjective    Objective: BP 120/76 (BP Location: Left Arm, Patient Position: Sitting)   Pulse 75   Wt 178 lb 12.8 oz (81.1 kg)   SpO2 98%   BMI 26.40 kg/m   Wt Readings from Last 3 Encounters:  03/08/17 178 lb 12.8 oz (81.1 kg)  02/19/17 175 lb 9.6 oz (79.7 kg)  09/24/16 171 lb 9.6 oz (77.8 kg)    General well-developed, well-nourished, no acute distress, lean white male No skin bruising or redness Abdomen with positive bowel  sounds, soft, non tender, no mass, no organomegaly, no surgical scars No extremity edema Upper and lower extremity pulses within normal Legs non tender, normal range of motion, no deformity Back: non tender, no scoliosis, no asymmetry, no mass, mild lumbar pain noted with flexion and extension, flexion range of motion full, extension range of motion full, lateral flexion full. Psych: pleasant, normal judgment, answers questions appropriately   Assessment: Encounter Diagnosis  Name Primary?  . Acute bilateral low back pain without sciatica Yes     Plan: We discussed possible causes of your lower back pain.   The main findings today on your exam were none.  You have a mostly normal exam. I suspect the cause of your back pain is spasm and stiffness, likely from the long commute.  Please go to [facility] for your back  xray.   Their hours are 8am - 4:30 pm Monday - Friday.  Take your insurance card with you.  [facility] [phone]  [address], [address] [place], [place] [address]  [address] [place], [place] [address]  Specific recommendations to help your current back pain include: Rest your back for the next 5-7 days Avoid strenuous activity  or heavy lifting for the next 5-7 days You may use heat to your back such as a hot shower, moist hot towel, or heat pad 2-3 times per day short-term Consider going to a massage therapist  You can use Naprosyn for pain and inflammation 1-2 times per day for the next 3-5 days then as needed.  (Caution-I do not intend for you to use anti-inflammatories such as ibuprofen, Aleve, or Advil on a regular basis due to risk of gastric bleeding or damage to the liver or kidney)  You may use the muscle relaxer Flexeril.   I prescribed to help reduce spasm and tension in your muscles.  Primarily use this as bedtime as it may cause drowsiness.  If you will be home for several hours during the day you may use 1/2 tablet during the  day.  Do not drive or operate machinery while taking this medication.  General recommendations to help prevent back problems and to keep your back strong and healthy: I recommend you get 150 minutes of aerobic exercise per week such as walking, running, swimming, dancing, golf or other exercise. I recommend you do a general stretching routine daily. To prevent back issues, I recommend you use some core strengthening exercises as we discussed.  For example, do the following regimen at least 2 days per week:  perform 2-3 sets of 25 in each of abdominal crunches or sit ups  perform 2-3 sets of 25 in each of core twists  perform 2-3 sets of 25 in each of upright rows  perform 2-3 sets of 25 in each of dead lifts Drink plenty of water throughout the day so that your urine is clear If you smoke, I strongly recommend you quit smoking as this causes inflammation throughout the body including your back  Please note: If you experience worsening or significantly different symptoms in the near future then call or return immediately If you develop fever, urinary changes, bowel changes, body aches and chills, then get reevaluated immediately by either calling, returning, or going to the urgent care or emergency department after hours or weekend If you have severe back pain compared to today's visit, or numbness of the genitalia, incontinence of bowel or bladder, or develop the inability to walk or stand, then go to the emergency department immediately  Follow up: pending xray   Renae Fickleaul was seen today for other.  Diagnoses and all orders for this visit:  Acute bilateral low back pain without sciatica -     DG Lumbar Spine Complete; Future  Other orders -     cyclobenzaprine (FLEXERIL) 10 MG tablet; 1/2- 1 tablet po QHS prn spasm -     naproxen (NAPROSYN) 500 MG tablet; Take 1 tablet (500 mg total) by mouth 2 (two) times daily with a meal.

## 2017-03-08 NOTE — Patient Instructions (Signed)
We discussed possible causes of your lower back pain.   The main findings today on your exam were none.  You have a mostly normal exam. I suspect the cause of your back pain is spasm and stiffness, likely from the long commute.  Please go to [facility] for your back  xray.   Their hours are 8am - 4:30 pm Monday - Friday.  Take your insurance card with you.  [facility] [id number]  [address] [place], [place] [address]  [address] [place], [place] [address]  Specific recommendations to help your current back pain include: Rest your back for the next 5-7 days Avoid strenuous activity or heavy lifting for the next 5-7 days You may use heat to your back such as a hot shower, moist hot towel, or heat pad 2-3 times per day short-term Consider going to a massage therapist  You can use Naprosyn for pain and inflammation 1-2 times per day for the next 3-5 days then as needed.  (Caution-I do not intend for you to use anti-inflammatories such as ibuprofen, Aleve, or Advil on a regular basis due to risk of gastric bleeding or damage to the liver or kidney)  You may use the muscle relaxer Flexeril.   I prescribed to help reduce spasm and tension in your muscles.  Primarily use this as bedtime as it may cause drowsiness.  If you will be home for several hours during the day you may use 1/2 tablet during the day.  Do not drive or operate machinery while taking this medication.  General recommendations to help prevent back problems and to keep your back strong and healthy: I recommend you get 150 minutes of aerobic exercise per week such as walking, running, swimming, dancing, golf or other exercise. I recommend you do a general stretching routine daily. To prevent back issues, I recommend you use some core strengthening exercises as we discussed.  For example, do the following regimen at least 2 days per week:  perform 2-3 sets of 25 in each of abdominal crunches or  sit ups  perform 2-3 sets of 25 in each of core twists  perform 2-3 sets of 25 in each of upright rows  perform 2-3 sets of 25 in each of dead lifts Drink plenty of water throughout the day so that your urine is clear If you smoke, I strongly recommend you quit smoking as this causes inflammation throughout the body including your back  Please note: If you experience worsening or significantly different symptoms in the near future then call or return immediately If you develop fever, urinary changes, bowel changes, body aches and chills, then get reevaluated immediately by either calling, returning, or going to the urgent care or emergency department after hours or weekend If you have severe back pain compared to today's visit, or numbness of the genitalia, incontinence of bowel or bladder, or develop the inability to walk or stand, then go to the emergency department immediately  Follow up: pending xray

## 2017-03-09 DIAGNOSIS — Z79899 Other long term (current) drug therapy: Secondary | ICD-10-CM | POA: Diagnosis not present

## 2017-03-09 DIAGNOSIS — F1099 Alcohol use, unspecified with unspecified alcohol-induced disorder: Secondary | ICD-10-CM | POA: Diagnosis not present

## 2017-03-09 DIAGNOSIS — F410 Panic disorder [episodic paroxysmal anxiety] without agoraphobia: Secondary | ICD-10-CM | POA: Diagnosis not present

## 2017-03-09 DIAGNOSIS — F3341 Major depressive disorder, recurrent, in partial remission: Secondary | ICD-10-CM | POA: Diagnosis not present

## 2017-04-10 DIAGNOSIS — F3341 Major depressive disorder, recurrent, in partial remission: Secondary | ICD-10-CM | POA: Diagnosis not present

## 2017-04-10 DIAGNOSIS — F1099 Alcohol use, unspecified with unspecified alcohol-induced disorder: Secondary | ICD-10-CM | POA: Diagnosis not present

## 2017-04-10 DIAGNOSIS — F410 Panic disorder [episodic paroxysmal anxiety] without agoraphobia: Secondary | ICD-10-CM | POA: Diagnosis not present

## 2017-04-23 ENCOUNTER — Other Ambulatory Visit: Payer: Self-pay | Admitting: Medical

## 2017-04-23 ENCOUNTER — Telehealth: Payer: Self-pay | Admitting: Medical

## 2017-04-23 NOTE — Telephone Encounter (Signed)
Make sure he is taking his Singulair.    He can try Mucinex DM and Flonase allergy spray next and see if this helps better.    Can also use Neti Pot or nasal saline flush to flush out mucous twice daily

## 2017-04-23 NOTE — Telephone Encounter (Signed)
Pt called stating that he has sinus congestion. He's had this for a while now. Been taking Mucinex, Theraflu at night, Alka Seltzer and non of this has helped. He is not able to cough out or blow out the mucus much during the day. He can only get mucus out first thing in the morning and it is tan-brownish. Pt has moved closed to [place] area to be closer to his job during the week so he is not able to get here for an appointment because difficult to miss time from work. Can he get meds to help? Ok to [facility] to [facility] [address]

## 2017-04-23 NOTE — Telephone Encounter (Signed)
Called patient, LVM to call back to discuss.   

## 2017-04-23 NOTE — Telephone Encounter (Signed)
Patient called back, advised of the message below and he stated he is taking all his medications.  No other concerns.  Patient will call if worsening symptoms occur.

## 2017-04-24 DIAGNOSIS — J019 Acute sinusitis, unspecified: Secondary | ICD-10-CM | POA: Diagnosis not present

## 2017-05-24 DIAGNOSIS — F410 Panic disorder [episodic paroxysmal anxiety] without agoraphobia: Secondary | ICD-10-CM | POA: Diagnosis not present

## 2017-05-24 DIAGNOSIS — F3341 Major depressive disorder, recurrent, in partial remission: Secondary | ICD-10-CM | POA: Diagnosis not present

## 2017-06-08 ENCOUNTER — Telehealth: Payer: Self-pay | Admitting: Medical

## 2017-06-08 NOTE — Telephone Encounter (Signed)
That is really a question for his psychiatrist.   He can ask psychiatry about modifying his medications as it seems to be a side effect of what he is taking .  Another option is an OTC medication called Yohimbine herb that sometimes helps, but I would also run this by psychiatry.

## 2017-06-08 NOTE — Telephone Encounter (Signed)
Pt requesting a med to help his decreased libido. He said meds he is taking for mental stability seem to be affecting his libido. Pt is living in [place], [place] during the week to be close to his job so will need med sent to [facility] at [address], [place], [place]. Advised pt that he will need to be seen for this but he still wants to ask [facility] for the med because pt is not able to come in right now.

## 2017-06-09 NOTE — Telephone Encounter (Signed)
lmom asking patient to call back for providers message.

## 2017-06-10 NOTE — Telephone Encounter (Signed)
Have tried reaching the patient several times to inform of providers response but unable to reach patient.

## 2017-09-11 ENCOUNTER — Other Ambulatory Visit: Payer: Self-pay | Admitting: Medical

## 2017-09-14 ENCOUNTER — Other Ambulatory Visit: Payer: Self-pay | Admitting: Medical

## 2017-09-14 ENCOUNTER — Telehealth: Payer: Self-pay | Admitting: Medical

## 2017-09-14 NOTE — Telephone Encounter (Signed)
Pt called and states moved to [place] and says [facility] is saying that he doesn't have a refill on his Singular.  I called [facility] and he does and I moved it to the [place] store t# [phone].

## 2017-10-28 ENCOUNTER — Other Ambulatory Visit: Payer: Self-pay

## 2017-10-28 MED ORDER — LOSARTAN POTASSIUM-HCTZ 100-12.5 MG PO TABS: 1.0000 | ORAL_TABLET | Freq: Every day | ORAL | 0 refills | Status: DC

## 2017-10-28 MED ORDER — MONTELUKAST SODIUM 10 MG PO TABS: 10.0000 mg | ORAL_TABLET | Freq: Every day | ORAL | 0 refills | Status: DC

## 2017-10-28 NOTE — Telephone Encounter (Signed)
[facility] is requesting a refill on the pending medications.

## 2017-11-12 DIAGNOSIS — N522 Drug-induced erectile dysfunction: Secondary | ICD-10-CM | POA: Diagnosis not present

## 2017-11-12 DIAGNOSIS — J301 Allergic rhinitis due to pollen: Secondary | ICD-10-CM | POA: Diagnosis not present

## 2017-11-12 DIAGNOSIS — F331 Major depressive disorder, recurrent, moderate: Secondary | ICD-10-CM | POA: Diagnosis not present

## 2017-11-12 DIAGNOSIS — I10 Essential (primary) hypertension: Secondary | ICD-10-CM | POA: Diagnosis not present

## 2017-12-13 DIAGNOSIS — R1013 Epigastric pain: Secondary | ICD-10-CM | POA: Diagnosis not present

## 2017-12-13 DIAGNOSIS — I10 Essential (primary) hypertension: Secondary | ICD-10-CM | POA: Diagnosis not present

## 2017-12-13 DIAGNOSIS — K210 Gastro-esophageal reflux disease with esophagitis: Secondary | ICD-10-CM | POA: Diagnosis not present

## 2017-12-13 DIAGNOSIS — N522 Drug-induced erectile dysfunction: Secondary | ICD-10-CM | POA: Diagnosis not present

## 2017-12-16 ENCOUNTER — Telehealth: Payer: Self-pay | Admitting: Medical

## 2017-12-16 NOTE — Telephone Encounter (Signed)
Dismissal letter in [employer]  °

## 2018-04-04 DIAGNOSIS — F3342 Major depressive disorder, recurrent, in full remission: Secondary | ICD-10-CM | POA: Diagnosis not present

## 2018-04-04 DIAGNOSIS — F410 Panic disorder [episodic paroxysmal anxiety] without agoraphobia: Secondary | ICD-10-CM | POA: Diagnosis not present

## 2018-12-07 DIAGNOSIS — J392 Other diseases of pharynx: Secondary | ICD-10-CM | POA: Diagnosis not present

## 2018-12-07 DIAGNOSIS — M545 Low back pain: Secondary | ICD-10-CM | POA: Diagnosis not present

## 2019-01-09 DIAGNOSIS — J018 Other acute sinusitis: Secondary | ICD-10-CM | POA: Diagnosis not present

## 2019-01-09 DIAGNOSIS — Z6826 Body mass index (BMI) 26.0-26.9, adult: Secondary | ICD-10-CM | POA: Diagnosis not present

## 2022-05-02 ENCOUNTER — Other Ambulatory Visit: Payer: Self-pay

## 2022-05-02 ENCOUNTER — Emergency Department (HOSPITAL_COMMUNITY)
Admission: EM | Admit: 2022-05-02 | Discharge: 2022-05-03 | Discharge status: Against Medical Advice | Payer: BC Managed Care – PPO | Attending: Student | Admitting: Student

## 2022-05-02 DIAGNOSIS — Z5321 Procedure and treatment not carried out due to patient leaving prior to being seen by health care provider: Secondary | ICD-10-CM | POA: Diagnosis not present

## 2022-05-02 DIAGNOSIS — R55 Syncope and collapse: Secondary | ICD-10-CM | POA: Insufficient documentation

## 2022-05-02 NOTE — ED Triage Notes (Signed)
Patient arrived with EMS from home reports brief syncopal episode at home this evening , alert and oriented at arrival /respirations unlabored . CBG=155 . Denies pain .

## 2022-05-03 LAB — CBC WITH DIFFERENTIAL/PLATELET
Abs Immature Granulocytes: 0.02 10*3/uL (ref 0.00–0.07)
Basophils Absolute: 0 10*3/uL (ref 0.0–0.1)
Basophils Relative: 0 %
Eosinophils Absolute: 0.2 10*3/uL (ref 0.0–0.5)
Eosinophils Relative: 2 %
HCT: 39.4 % (ref 39.0–52.0)
Hemoglobin: 13.9 g/dL (ref 13.0–17.0)
Immature Granulocytes: 0 %
Lymphocytes Relative: 17 %
Lymphs Abs: 1.7 10*3/uL (ref 0.7–4.0)
MCH: 30.3 pg (ref 26.0–34.0)
MCHC: 35.3 g/dL (ref 30.0–36.0)
MCV: 86 fL (ref 80.0–100.0)
Monocytes Absolute: 0.9 10*3/uL (ref 0.1–1.0)
Monocytes Relative: 9 %
Neutro Abs: 7.4 10*3/uL (ref 1.7–7.7)
Neutrophils Relative %: 72 %
Platelets: 241 10*3/uL (ref 150–400)
RBC: 4.58 MIL/uL (ref 4.22–5.81)
RDW: 11.8 % (ref 11.5–15.5)
WBC: 10.3 10*3/uL (ref 4.0–10.5)
nRBC: 0 % (ref 0.0–0.2)

## 2022-05-03 LAB — COMPREHENSIVE METABOLIC PANEL
ALT: 26 U/L (ref 0–44)
AST: 18 U/L (ref 15–41)
Albumin: 3.7 g/dL (ref 3.5–5.0)
Alkaline Phosphatase: 55 U/L (ref 38–126)
Anion gap: 13 (ref 5–15)
BUN: 13 mg/dL (ref 6–20)
CO2: 25 mmol/L (ref 22–32)
Calcium: 9.4 mg/dL (ref 8.9–10.3)
Chloride: 90 mmol/L — ABNORMAL LOW (ref 98–111)
Creatinine, Ser: 1.2 mg/dL (ref 0.61–1.24)
GFR, Estimated: >60 mL/min (ref >60)
Glucose, Bld: 152 mg/dL — ABNORMAL HIGH (ref 70–99)
Potassium: 3.7 mmol/L (ref 3.5–5.1)
Sodium: 128 mmol/L — ABNORMAL LOW (ref 135–145)
Total Bilirubin: 0.7 mg/dL (ref 0.3–1.2)
Total Protein: 6.5 g/dL (ref 6.5–8.1)

## 2022-05-03 NOTE — ED Notes (Signed)
Pt did not answer for vitals or when being called back for a room.

## 2022-05-08 ENCOUNTER — Encounter (HOSPITAL_COMMUNITY): Payer: Self-pay

## 2022-05-08 ENCOUNTER — Emergency Department (HOSPITAL_COMMUNITY): Payer: BC Managed Care – PPO

## 2022-05-08 ENCOUNTER — Emergency Department (HOSPITAL_COMMUNITY)
Admission: EM | Admit: 2022-05-08 | Discharge: 2022-05-09 | Disposition: A | Discharge status: Home / Self Care | Payer: BC Managed Care – PPO | Attending: Emergency Medicine | Admitting: Emergency Medicine

## 2022-05-08 ENCOUNTER — Other Ambulatory Visit: Payer: Self-pay

## 2022-05-08 ENCOUNTER — Ambulatory Visit (HOSPITAL_COMMUNITY)
Admission: EM | Admit: 2022-05-08 | Discharge: 2022-05-08 | Disposition: A | Discharge status: Other Acute Inpatient Hospital | Payer: BC Managed Care – PPO | Attending: Urology | Admitting: Urology

## 2022-05-08 DIAGNOSIS — Z008 Encounter for other general examination: Secondary | ICD-10-CM

## 2022-05-08 DIAGNOSIS — Z711 Person with feared health complaint in whom no diagnosis is made: Secondary | ICD-10-CM | POA: Diagnosis present

## 2022-05-08 DIAGNOSIS — F331 Major depressive disorder, recurrent, moderate: Secondary | ICD-10-CM

## 2022-05-08 DIAGNOSIS — F411 Generalized anxiety disorder: Secondary | ICD-10-CM | POA: Diagnosis not present

## 2022-05-08 MED ORDER — ACETAMINOPHEN 325 MG PO TABS: 650.0000 mg | ORAL_TABLET | Freq: Once | ORAL | Status: DC

## 2022-05-08 NOTE — ED Notes (Signed)
NP Ene Notified of patient's need for transport to the ED for medical clearance with accepting physician Dr Lockie Mola. [facility] notified of need to transport due to guest being present under IVC. Report called to RN Candace at ED notifying her of pending arrival. Awaiting transport from [facility] to transfer.

## 2022-05-08 NOTE — ED Notes (Signed)
Patient discharged to care of [facility] and transferred from facility via [facility] due to IVC status. Patient compliant during transport to care of [facility]. Patient given all personal items from locker and transported with [facility] to Hospital. Report previously called to [facility] RN.
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
## 2022-05-08 NOTE — ED Provider Notes (Incomplete)
Behavioral Health Urgent Care Medical Screening Exam  Patient Name: [name] MRN: [id number] Date of Evaluation: 05/09/22 Chief Complaint:   Diagnosis:  Final diagnoses:  MDD (major depressive disorder), recurrent episode, moderate (HCC)  GAD (generalized anxiety disorder)    History of Present illness: [name] is a 57 y.o. male with psychiatric history of depression and anxiety.  Patient was brought to [facility] under IVC petition for mental health evaluation.   Per IVC affidavit: "Respondent has been diagnosed with bipolar disorder, depression, and anxiety and is on medication that he is taking.  Respondent recently went through a change in medications and has become very aggressive towards his partner and told her she was going to hell and he was going to heaven.  Stated that he is sending all use of the Lord and thinks he is God and sometimes think his son is God.  Respondent has been disoriented and not talking straight.  Respondent recently suffered a series of falls where he hits his head. He called 911 for himself but after arriving in the hospital convinced a stranger to take him home where he proceeded to climb into his home through a window and kept thinking that paramedics were at his home to take him back to the hospital when no one was there.  Recently had an episode where he stopped his vehicle in the middle of [place] took the keys got out and ran off.  Respondent told his partner that he was going to burn her and once she was burned there will be nothing left forever.  Without intervention the respondent could harm himself or others."   Patient was seen face-to-face and his chart was reviewed by this nurse practitioner.  On assessment, patient is alert and oriented x 4. Patient's mood and affect are labile, with patient crying one moment and then smiling the next. Patient speech is clear and tangential. Thought process is disorganized with flight of ideas. Patient did not  appear to be responding to internal/external stimuli or experiencing delusional thought content.   Patient has flight of ideas; he rambles about different topics and had a difficulty time participating in this assessment. He denies all allegation made in IVC affidavit  Patient reports that he is under a lot of stress due to financial difficulties and relationship conflict with his girlfriend. He report that he is the only one in the home that is working; he says he works 16 hours/day, 5 days/week. He says he is prescribed Clonazepam 1mg /day for anxiety and additional 1mg /day prn for panic attacks. He reports that he has not taken clonazepam in 10 days because he threw this medication away. He report that he is going through a "benzo withdraw and feels very anxious." He reports "falling and blacking out" 5 nights ago due to "benzo withdraw." He reports hitting his head when he fell and losing conscience. He says EMS informed him his son had to physically stimulate him to wake up after the fall.   Per medical record, patient was transported to [facility] by EMS on 05/02/2022 due to syncope however patient left the ED prior to medical evaluation.  Patient is currently complaining of headache 6/10, sensitive to light, nausea and  inability to fall asleep since falling.   Due to recent fall and loss of conscience, patient will be transferred to [facility] for medical clearance. He may return to [facility] for continuous assessment post medical clearance. Report call to EDP Dr. Gentry Fitz Row  ED from 05/08/2022 in [facility] Emergency Department at [facility] Most recent reading at 05/08/2022 10:59 PM ED from 05/08/2022 in [facility] Most recent reading at 05/08/2022  6:18 PM ED from 05/02/2022 in [facility] Emergency Department at [facility] Most recent reading at 05/02/2022 11:49 PM  C-SSRS RISK CATEGORY No Risk No Risk No Risk       Psychiatric Specialty  Exam  Presentation  General Appearance:Appropriate for Environment  Eye Contact:Good  Speech:Clear and Coherent  Speech Volume:Normal  Handedness:Right   Mood and Affect  Mood: Labile  Affect: Labile   Thought Process  Thought Processes: Disorganized  Descriptions of Associations:Tangential  Orientation:Full (Time, Place and Person)  Thought Content:Tangential  Diagnosis of Schizophrenia or Schizoaffective disorder in past: No  Duration of Psychotic Symptoms: Less than six months  Hallucinations:None  Ideas of Reference:None  Suicidal Thoughts:No  Homicidal Thoughts:No   Sensorium  Memory: Immediate Good; Recent Good; Remote Fair  Judgment:No data recorded Insight: Poor   Executive Functions  Concentration: Poor  Attention Span: Poor  Recall:No data recorded Fund of Knowledge: Fair  Language: Fair   Psychomotor Activity  Psychomotor Activity: Normal   Assets  Assets: Manufacturing systems engineer; Desire for Improvement; Housing; Physical Health   Sleep  Sleep: Poor (pt report no sleep in 5 days)  Number of hours: No data recorded  Physical Exam: Physical Exam Vitals and nursing note reviewed.  Constitutional:      General: He is not in acute distress.    Appearance: He is well-developed.  HENT:     Head: Normocephalic and atraumatic.  Eyes:     Conjunctiva/sclera: Conjunctivae normal.  Cardiovascular:     Rate and Rhythm: Normal rate.  Pulmonary:     Effort: Pulmonary effort is normal. No respiratory distress.  Abdominal:     Palpations: Abdomen is soft.     Tenderness: There is no abdominal tenderness.  Musculoskeletal:        General: No swelling. Normal range of motion.     Cervical back: Normal range of motion.  Skin:    General: Skin is warm and dry.     Capillary Refill: Capillary refill takes less than 2 seconds.  Neurological:     Mental Status: He is alert and oriented to person, place, and time.  Psychiatric:         Attention and Perception: Attention and perception normal.        Mood and Affect: Mood is anxious. Affect is labile.        Speech: Speech normal.        Behavior: Behavior normal. Behavior is cooperative.        Thought Content: Thought content normal.        Cognition and Memory: Cognition normal.    Review of Systems  Constitutional: Negative.   HENT: Negative.    Eyes: Negative.   Respiratory: Negative.    Cardiovascular: Negative.   Gastrointestinal: Negative.   Genitourinary: Negative.   Musculoskeletal: Negative.   Skin: Negative.   Neurological: Negative.   Endo/Heme/Allergies: Negative.   Psychiatric/Behavioral:  The patient is nervous/anxious.    Blood pressure (!) 140/88, pulse 86, temperature 98.5 F (36.9 C), resp. rate 18, SpO2 98 %. There is no height or weight on file to calculate BMI.  Musculoskeletal: Strength & Muscle Tone: within normal limits Gait & Station: normal Patient leans: Right   [facility] MSE Discharge Disposition for Follow up and Recommendations: Based on my  evaluation the patient appears to have an emergency medical condition for which I recommend the patient be transferred to the emergency department for further evaluation.   Due to recent fall and loss of conscience, patient will be transferred to [facility] for medical clearance. He may return to [facility] for continuous assessment post medical clearance. Report call to EDP Dr. Rudi Heap, NP 05/09/2022, 1:58 AM

## 2022-05-09 ENCOUNTER — Ambulatory Visit (HOSPITAL_COMMUNITY)
Admission: EM | Admit: 2022-05-09 | Discharge: 2022-05-11 | Disposition: A | Discharge status: Other Acute Inpatient Hospital | Payer: BC Managed Care – PPO | Attending: Urology | Admitting: Urology

## 2022-05-09 ENCOUNTER — Other Ambulatory Visit: Payer: Self-pay

## 2022-05-09 DIAGNOSIS — F312 Bipolar disorder, current episode manic severe with psychotic features: Secondary | ICD-10-CM | POA: Diagnosis not present

## 2022-05-09 DIAGNOSIS — Z1152 Encounter for screening for COVID-19: Secondary | ICD-10-CM | POA: Diagnosis not present

## 2022-05-09 DIAGNOSIS — F411 Generalized anxiety disorder: Secondary | ICD-10-CM | POA: Diagnosis not present

## 2022-05-09 DIAGNOSIS — R519 Headache, unspecified: Secondary | ICD-10-CM | POA: Insufficient documentation

## 2022-05-09 DIAGNOSIS — F331 Major depressive disorder, recurrent, moderate: Secondary | ICD-10-CM | POA: Diagnosis not present

## 2022-05-09 DIAGNOSIS — Z79899 Other long term (current) drug therapy: Secondary | ICD-10-CM | POA: Diagnosis not present

## 2022-05-09 DIAGNOSIS — R9431 Abnormal electrocardiogram [ECG] [EKG]: Secondary | ICD-10-CM | POA: Diagnosis not present

## 2022-05-09 DIAGNOSIS — R110 Nausea: Secondary | ICD-10-CM | POA: Insufficient documentation

## 2022-05-09 DIAGNOSIS — H538 Other visual disturbances: Secondary | ICD-10-CM | POA: Insufficient documentation

## 2022-05-09 LAB — CBC WITH DIFFERENTIAL/PLATELET
Abs Immature Granulocytes: 0.02 10*3/uL (ref 0.00–0.07)
Basophils Absolute: 0.1 10*3/uL (ref 0.0–0.1)
Basophils Relative: 1 %
Eosinophils Absolute: 0.3 10*3/uL (ref 0.0–0.5)
Eosinophils Relative: 3 %
HCT: 42.1 % (ref 39.0–52.0)
Hemoglobin: 15.3 g/dL (ref 13.0–17.0)
Immature Granulocytes: 0 %
Lymphocytes Relative: 29 %
Lymphs Abs: 2.2 10*3/uL (ref 0.7–4.0)
MCH: 30.2 pg (ref 26.0–34.0)
MCHC: 36.3 g/dL — ABNORMAL HIGH (ref 30.0–36.0)
MCV: 83 fL (ref 80.0–100.0)
Monocytes Absolute: 0.8 10*3/uL (ref 0.1–1.0)
Monocytes Relative: 11 %
Neutro Abs: 4.2 10*3/uL (ref 1.7–7.7)
Neutrophils Relative %: 56 %
Platelets: 346 10*3/uL (ref 150–400)
RBC: 5.07 MIL/uL (ref 4.22–5.81)
RDW: 11.7 % (ref 11.5–15.5)
WBC: 7.6 10*3/uL (ref 4.0–10.5)
nRBC: 0 % (ref 0.0–0.2)

## 2022-05-09 LAB — POCT URINE DRUG SCREEN - MANUAL ENTRY (I-SCREEN)
POC Amphetamine UR: NOT DETECTED
POC Buprenorphine (BUP): NOT DETECTED
POC Cocaine UR: NOT DETECTED
POC Marijuana UR: NOT DETECTED
POC Methadone UR: NOT DETECTED
POC Methamphetamine UR: NOT DETECTED
POC Morphine: NOT DETECTED
POC Oxazepam (BZO): NOT DETECTED
POC Oxycodone UR: NOT DETECTED
POC Secobarbital (BAR): NOT DETECTED

## 2022-05-09 LAB — COMPREHENSIVE METABOLIC PANEL
ALT: 39 U/L (ref 0–44)
AST: 23 U/L (ref 15–41)
Albumin: 3.7 g/dL (ref 3.5–5.0)
Alkaline Phosphatase: 61 U/L (ref 38–126)
Anion gap: 9 (ref 5–15)
BUN: 16 mg/dL (ref 6–20)
CO2: 28 mmol/L (ref 22–32)
Calcium: 8.8 mg/dL — ABNORMAL LOW (ref 8.9–10.3)
Chloride: 95 mmol/L — ABNORMAL LOW (ref 98–111)
Creatinine, Ser: 1 mg/dL (ref 0.61–1.24)
GFR, Estimated: >60 mL/min (ref >60)
Glucose, Bld: 104 mg/dL — ABNORMAL HIGH (ref 70–99)
Potassium: 3.6 mmol/L (ref 3.5–5.1)
Sodium: 132 mmol/L — ABNORMAL LOW (ref 135–145)
Total Bilirubin: 0.5 mg/dL (ref 0.3–1.2)
Total Protein: 6.2 g/dL — ABNORMAL LOW (ref 6.5–8.1)

## 2022-05-09 LAB — ETHANOL: Alcohol, Ethyl (B): <10 mg/dL (ref <10)

## 2022-05-09 LAB — HEMOGLOBIN A1C
Hgb A1c MFr Bld: 5.4 % (ref 4.8–5.6)
Mean Plasma Glucose: 108.28 mg/dL

## 2022-05-09 LAB — LIPID PANEL
Cholesterol: 168 mg/dL (ref 0–200)
HDL: 40 mg/dL — ABNORMAL LOW (ref >40)
LDL Cholesterol: 113 mg/dL — ABNORMAL HIGH (ref 0–99)
Total CHOL/HDL Ratio: 4.2 RATIO
Triglycerides: 77 mg/dL (ref <150)
VLDL: 15 mg/dL (ref 0–40)

## 2022-05-09 LAB — TSH: TSH: 1.127 u[IU]/mL (ref 0.350–4.500)

## 2022-05-09 MED ORDER — MAGNESIUM HYDROXIDE 400 MG/5ML PO SUSP: 30.0000 mL | Freq: Every day | ORAL | Status: DC | PRN

## 2022-05-09 MED ORDER — HYDROXYZINE HCL 25 MG PO TABS
25.0000 mg | ORAL_TABLET | Freq: Three times a day (TID) | ORAL | Status: DC | PRN
  Administered 2022-05-09 – 2022-05-10 (×3): 25 mg via ORAL
  Filled 2022-05-09 (×3): qty 1

## 2022-05-09 MED ORDER — ALUM & MAG HYDROXIDE-SIMETH 200-200-20 MG/5ML PO SUSP
30.0000 mL | ORAL | Status: DC | PRN
  Administered 2022-05-09 – 2022-05-11 (×3): 30 mL via ORAL
  Filled 2022-05-09 (×3): qty 30

## 2022-05-09 MED ORDER — DIVALPROEX SODIUM 500 MG PO DR TAB
500.0000 mg | DELAYED_RELEASE_TABLET | Freq: Two times a day (BID) | ORAL | Status: DC
  Administered 2022-05-09 – 2022-05-11 (×4): 500 mg via ORAL
  Filled 2022-05-09 (×4): qty 1

## 2022-05-09 MED ORDER — TRAZODONE HCL 50 MG PO TABS
50.0000 mg | ORAL_TABLET | Freq: Every evening | ORAL | Status: DC | PRN
  Administered 2022-05-09 – 2022-05-10 (×3): 50 mg via ORAL
  Filled 2022-05-09 (×3): qty 1

## 2022-05-09 MED ORDER — OLANZAPINE 5 MG PO TBDP
5.0000 mg | ORAL_TABLET | Freq: Two times a day (BID) | ORAL | Status: DC
  Administered 2022-05-09 – 2022-05-11 (×4): 5 mg via ORAL
  Filled 2022-05-09 (×4): qty 1

## 2022-05-09 MED ORDER — ACETAMINOPHEN 325 MG PO TABS
650.0000 mg | ORAL_TABLET | Freq: Four times a day (QID) | ORAL | Status: DC | PRN
  Administered 2022-05-09 – 2022-05-11 (×3): 650 mg via ORAL
  Filled 2022-05-09 (×3): qty 2

## 2022-05-09 MED ORDER — OLANZAPINE 10 MG PO TBDP
10.0000 mg | ORAL_TABLET | ORAL | Status: AC
  Administered 2022-05-09: 10 mg via ORAL
  Filled 2022-05-09: qty 1

## 2022-05-09 NOTE — ED Provider Notes (Signed)
[facility] Urgent Care Continuous Assessment Admission H&P  Date: 05/09/22 Patient Name: [name] MRN: [id number] Chief Complaint: "my girlfriend put me under IVC"  Diagnoses:  Final diagnoses:  MDD (major depressive disorder), recurrent episode, moderate (HCC)    HPI: [name] is a 57 y.o. male with psychiatric history of depression and anxiety.  Patient initially presented to [facility] under IVC petition for mental health evaluation.    Of note, patient had a syncopal episode on 05/02/22 at home. He was transferred by EMS to [facility] but patient eloped from [facility] prior to medical evaluation. He presented to [facility] today with ongoing complaint of headache, sensitivity to light, blurred vision and nausea. [facility] was sent to [facility] from [facility] for medical clearance.   Patient returns for [facility] post medical clearance. On assessment, patient is alert and oriented x 4; he is calm and cooperative.  He is in no apparent distress; he is observed eating a sandwich.  Patient reports headaches of 3/10 (10 is worst pain).  Patient denies blurred vision, nausea, and sensitivity to light. Patient has flight of ideas. He rambles about different topic and has a hard time staying on topic. He continues to denies all allegation made in IVC affidavit.  Patient continues to deny suicidal ideation, history of suicidal attempt, or self harming. He denies HI/hallucination/paranoia and substance abuse. He says he is depress and overwhelmed due to financial trouble and relation difficulties. He endorse depressive symptoms of hopelessness, irritability, racing thoughts, poor sleep, isolation, and poor focus.   Home Medication: Cymbalta 60mg /day Seroquel 25mg /day Clonazepam 1mg /day    Per IVC affidavit: "Respondent has been diagnosed with bipolar disorder, depression, and anxiety and is on medication that he is taking.  Respondent recently went through a change in medications and has become very aggressive towards his partner  and told her she was going to hell and he was going to heaven.  Stated that he is sending all use of the Lord and thinks he is God and sometimes think his son is God.  Respondent has been disoriented and not talking straight.  Respondent recently suffered a series of falls where he hits his head.  He called 911 for himself but after arriving in the hospital convinced a stranger to take him home where he proceeded to climb into his home through a window and kept thinking that paramedics were at his home to take him back to the hospital when no one was there.  Recently had an episode where he stopped his vehicle in the middle of [place] took the keys got out and ran off.  Respondent told his partner that he was going to burn her and once she was burned there will be nothing left forever.  Without intervention the respondent could harm himself or others."       Total Time spent with patient: 15 minutes  Musculoskeletal  Strength & Muscle Tone: within normal limits Gait & Station: normal Patient leans: Right  Psychiatric Specialty Exam  Presentation General Appearance:  Appropriate for Environment  Eye Contact: Good  Speech: Clear and Coherent  Speech Volume: Normal  Handedness: Right   Mood and Affect  Mood: Labile  Affect: Labile   Thought Process  Thought Processes: Disorganized  Descriptions of Associations:Tangential  Orientation:Full (Time, Place and Person)  Thought Content:Tangential  Diagnosis of Schizophrenia or Schizoaffective disorder in past: No  Duration of Psychotic Symptoms: Less than six months  Hallucinations:Hallucinations: None  Ideas of Reference:None  Suicidal Thoughts:Suicidal Thoughts:  No  Homicidal Thoughts:Homicidal Thoughts: No   Sensorium  Memory: Immediate Good; Recent Good; Remote Fair  Judgment:No data recorded Insight: Poor   Executive Functions  Concentration: Poor  Attention Span: Poor  Recall:No data  recorded Fund of Knowledge: Fair  Language: Fair   Psychomotor Activity  Psychomotor Activity: Psychomotor Activity: Normal   Assets  Assets: Manufacturing systems engineer; Desire for Improvement; Housing; Physical Health   Sleep  Sleep: Sleep: Poor (pt report no sleep in 5 days)   Nutritional Assessment (For OBS and FBC admissions only) Has the patient had a weight loss or gain of 10 pounds or more in the last 3 months?: No Has the patient had a decrease in food intake/or appetite?: No Does the patient have dental problems?: No Does the patient have eating habits or behaviors that may be indicators of an eating disorder including binging or inducing vomiting?: No Has the patient recently lost weight without trying?: 0 Has the patient been eating poorly because of a decreased appetite?: 0 Malnutrition Screening Tool Score: 0    Physical Exam Vitals and nursing note reviewed.  Constitutional:      General: He is not in acute distress.    Appearance: He is well-developed.  HENT:     Head: Normocephalic and atraumatic.  Eyes:     Conjunctiva/sclera: Conjunctivae normal.  Cardiovascular:     Rate and Rhythm: Normal rate.  Pulmonary:     Effort: Pulmonary effort is normal. No respiratory distress.     Breath sounds: Normal breath sounds.  Abdominal:     Palpations: Abdomen is soft.     Tenderness: There is no abdominal tenderness.  Musculoskeletal:        General: No swelling.     Cervical back: Neck supple.  Skin:    General: Skin is warm and dry.     Capillary Refill: Capillary refill takes less than 2 seconds.  Neurological:     Mental Status: He is alert.  Psychiatric:        Attention and Perception: Attention and perception normal.        Mood and Affect: Mood is depressed.        Speech: Speech normal.        Behavior: Behavior normal. Behavior is cooperative.        Thought Content: Thought content normal.        Cognition and Memory: Cognition normal.     Review of Systems  Constitutional: Negative.   HENT: Negative.    Eyes: Negative.   Respiratory: Negative.    Cardiovascular: Negative.   Gastrointestinal: Negative.   Genitourinary: Negative.   Musculoskeletal: Negative.   Skin: Negative.   Neurological: Negative.   Endo/Heme/Allergies: Negative.   Psychiatric/Behavioral:  Positive for depression. The patient is nervous/anxious.     Blood pressure 115/72, pulse 65, temperature 98 F (36.7 C), temperature source Oral, resp. rate 18, SpO2 97 %. There is no height or weight on file to calculate BMI.  Past Psychiatric History:  Past Medical History:  Diagnosis Date   Anxiety    Chronic pain    Depression    History of alcohol abuse    prior to 2010   Hypertension    Insomnia       Is the patient at risk to self? No  Has the patient been a risk to self in the past 6 months? No .    Has the patient been a risk to self within the distant past? No  Is the patient a risk to others? No   Has the patient been a risk to others in the past 6 months? No   Has the patient been a risk to others within the distant past? No   Past Medical History:  Past Medical History:  Diagnosis Date   Anxiety    Chronic pain    Depression    History of alcohol abuse    prior to 2010   Hypertension    Insomnia      Family History:  Family History  Problem Relation Age of Onset   Cancer Mother        breast and bone   Other Father        died age 68yo when Shloime was 55yo   Diabetes Sister    Bipolar disorder Sister    Heart disease Neg Hx    Stroke Neg Hx    Hypertension Neg Hx      Social History:  Social History   Tobacco Use   Smoking status: Never   Smokeless tobacco: Never  Vaping Use   Vaping Use: Never used  Substance Use Topics   Alcohol use: No   Drug use: No     Last Labs:  Admission on 05/09/2022  Component Date Value Ref Range Status   TSH 05/09/2022 1.127  0.350 - 4.500 uIU/mL Final   Comment:  Performed by a 3rd Generation assay with a functional sensitivity of <=0.01 uIU/mL. Performed at [facility], [address], [place], [place] [address]    Alcohol, Ethyl (B) 05/09/2022 <10  <10 mg/dL Final   Comment: (NOTE) Lowest detectable limit for serum alcohol is 10 mg/dL.  For medical purposes only. Performed at [facility], [address], [place], [place] [address]    Hgb A1c MFr Bld 05/09/2022 5.4  4.8 - 5.6 % Final   Comment: (NOTE) Pre diabetes:          5.7%-6.4%  Diabetes:              >6.4%  Glycemic control for   <7.0% adults with diabetes    Mean Plasma Glucose 05/09/2022 108.28  mg/dL Final   Performed at [facility], [address] [place], [place] [address]   WBC 05/09/2022 7.6  4.0 - 10.5 K/uL Final   RBC 05/09/2022 5.07  4.22 - 5.81 MIL/uL Final   Hemoglobin 05/09/2022 15.3  13.0 - 17.0 g/dL Final   HCT 65/78/4696 42.1  39.0 - 52.0 % Final   MCV 05/09/2022 83.0  80.0 - 100.0 fL Final   MCH 05/09/2022 30.2  26.0 - 34.0 pg Final   MCHC 05/09/2022 36.3 (H)  30.0 - 36.0 g/dL Final   RDW 29/52/8413 11.7  11.5 - 15.5 % Final   Platelets 05/09/2022 346  150 - 400 K/uL Final   nRBC 05/09/2022 0.0  0.0 - 0.2 % Final   Neutrophils Relative % 05/09/2022 56  % Final   Neutro Abs 05/09/2022 4.2  1.7 - 7.7 K/uL Final   Lymphocytes Relative 05/09/2022 29  % Final   Lymphs Abs 05/09/2022 2.2  0.7 - 4.0 K/uL Final   Monocytes Relative 05/09/2022 11  % Final   Monocytes Absolute 05/09/2022 0.8  0.1 - 1.0 K/uL Final   Eosinophils Relative 05/09/2022 3  % Final   Eosinophils Absolute 05/09/2022 0.3  0.0 - 0.5 K/uL Final   Basophils Relative 05/09/2022 1  % Final   Basophils Absolute  05/09/2022 0.1  0.0 - 0.1 K/uL Final   Immature Granulocytes 05/09/2022 0  % Final   Abs Immature Granulocytes 05/09/2022 0.02  0.00 - 0.07 K/uL Final   Performed at [facility], [address], [place], [place] [address]   POC Amphetamine UR 05/09/2022 None  Detected  NONE DETECTED (Cut Off Level 1000 ng/mL) Final   POC Secobarbital (BAR) 05/09/2022 None Detected  NONE DETECTED (Cut Off Level 300 ng/mL) Final   POC Buprenorphine (BUP) 05/09/2022 None Detected  NONE DETECTED (Cut Off Level 10 ng/mL) Final   POC Oxazepam (BZO) 05/09/2022 None Detected  NONE DETECTED (Cut Off Level 300 ng/mL) Final   POC Cocaine UR 05/09/2022 None Detected  NONE DETECTED (Cut Off Level 300 ng/mL) Final   POC Methamphetamine UR 05/09/2022 None Detected  NONE DETECTED (Cut Off Level 1000 ng/mL) Final   POC Morphine 05/09/2022 None Detected  NONE DETECTED (Cut Off Level 300 ng/mL) Final   POC Methadone UR 05/09/2022 None Detected  NONE DETECTED (Cut Off Level 300 ng/mL) Final   POC Oxycodone UR 05/09/2022 None Detected  NONE DETECTED (Cut Off Level 100 ng/mL) Final   POC Marijuana UR 05/09/2022 None Detected  NONE DETECTED (Cut Off Level 50 ng/mL) Final   Cholesterol 05/09/2022 168  0 - 200 mg/dL Final   Triglycerides 69/62/9528 77  <150 mg/dL Final   HDL 41/32/4401 40 (L)  >40 mg/dL Final   Total CHOL/HDL Ratio 05/09/2022 4.2  RATIO Final   VLDL 05/09/2022 15  0 - 40 mg/dL Final   LDL Cholesterol 05/09/2022 113 (H)  0 - 99 mg/dL Final   Comment:        Total Cholesterol/HDL:CHD Risk Coronary Heart Disease Risk Table                     Men   Women  1/2 Average Risk   3.4   3.3  Average Risk       5.0   4.4  2 X Average Risk   9.6   7.1  3 X Average Risk  23.4   11.0        Use the calculated Patient Ratio above and the CHD Risk Table to determine the patient's CHD Risk.        ATP III CLASSIFICATION (LDL):  <100     mg/dL   Optimal  027-253  mg/dL   Near or Above                    Optimal  130-159  mg/dL   Borderline  664-403  mg/dL   High  >474     mg/dL   Very High Performed at [facility], [address] [place], [place] [address]   Admission on 05/02/2022, Discharged on 05/03/2022  Component Date Value Ref Range Status   WBC 05/02/2022  10.3  4.0 - 10.5 K/uL Final   RBC 05/02/2022 4.58  4.22 - 5.81 MIL/uL Final   Hemoglobin 05/02/2022 13.9  13.0 - 17.0 g/dL Final   HCT 38/75/6433 39.4  39.0 - 52.0 % Final   MCV 05/02/2022 86.0  80.0 - 100.0 fL Final   MCH 05/02/2022 30.3  26.0 - 34.0 pg Final   MCHC 05/02/2022 35.3  30.0 - 36.0 g/dL Final   RDW 29/51/8841 11.8  11.5 - 15.5 % Final   Platelets 05/02/2022 241  150 - 400 K/uL Final   nRBC 05/02/2022 0.0  0.0 -  0.2 % Final   Neutrophils Relative % 05/02/2022 72  % Final   Neutro Abs 05/02/2022 7.4  1.7 - 7.7 K/uL Final   Lymphocytes Relative 05/02/2022 17  % Final   Lymphs Abs 05/02/2022 1.7  0.7 - 4.0 K/uL Final   Monocytes Relative 05/02/2022 9  % Final   Monocytes Absolute 05/02/2022 0.9  0.1 - 1.0 K/uL Final   Eosinophils Relative 05/02/2022 2  % Final   Eosinophils Absolute 05/02/2022 0.2  0.0 - 0.5 K/uL Final   Basophils Relative 05/02/2022 0  % Final   Basophils Absolute 05/02/2022 0.0  0.0 - 0.1 K/uL Final   Immature Granulocytes 05/02/2022 0  % Final   Abs Immature Granulocytes 05/02/2022 0.02  0.00 - 0.07 K/uL Final   Performed at [facility], [address], [place], [place] [address]   Sodium 05/02/2022 128 (L)  135 - 145 mmol/L Final   Potassium 05/02/2022 3.7  3.5 - 5.1 mmol/L Final   Chloride 05/02/2022 90 (L)  98 - 111 mmol/L Final   CO2 05/02/2022 25  22 - 32 mmol/L Final   Glucose, Bld 05/02/2022 152 (H)  70 - 99 mg/dL Final   Glucose reference range applies only to samples taken after fasting for at least 8 hours.   BUN 05/02/2022 13  6 - 20 mg/dL Final   Creatinine, Ser 05/02/2022 1.20  0.61 - 1.24 mg/dL Final   Calcium 60/45/4098 9.4  8.9 - 10.3 mg/dL Final   Total Protein 11/91/4782 6.5  6.5 - 8.1 g/dL Final   Albumin 95/62/1308 3.7  3.5 - 5.0 g/dL Final   AST 65/78/4696 18  15 - 41 U/L Final   ALT 05/02/2022 26  0 - 44 U/L Final   Alkaline Phosphatase 05/02/2022 55  38 - 126 U/L Final   Total Bilirubin 05/02/2022 0.7  0.3 - 1.2 mg/dL  Final   GFR, Estimated 05/02/2022 >60  >60 mL/min Final   Comment: (NOTE) Calculated using the CKD-EPI Creatinine Equation (2021)    Anion gap 05/02/2022 13  5 - 15 Final   Performed at [facility], [address], [place], [place] [address]    Allergies: Benadryl [diphenhydramine], Doxycycline, Penicillins, and Sulfa antibiotics  Medications:  Facility Ordered Medications  Medication   acetaminophen (TYLENOL) tablet 650 mg   alum & mag hydroxide-simeth (MAALOX/MYLANTA) 200-200-20 MG/5ML suspension 30 mL   magnesium hydroxide (MILK OF MAGNESIA) suspension 30 mL   traZODone (DESYREL) tablet 50 mg   hydrOXYzine (ATARAX) tablet 25 mg   PTA Medications  Medication Sig   mirtazapine (REMERON) 30 MG tablet    DULoxetine (CYMBALTA) 30 MG capsule TAKE ONE CAPSULE BY MOUTH EVERY DAY   hydrOXYzine (VISTARIL) 25 MG capsule TAKE 1 CAPSULE(25 MG) BY MOUTH AT BEDTIME   QUEtiapine (SEROQUEL) 50 MG tablet Take 1 tablet (50 mg total) by mouth at bedtime.   cyclobenzaprine (FLEXERIL) 10 MG tablet 1/2- 1 tablet po QHS prn spasm   naproxen (NAPROSYN) 500 MG tablet Take 1 tablet (500 mg total) by mouth 2 (two) times daily with a meal.   losartan-hydrochlorothiazide (HYZAAR) 100-12.5 MG tablet Take 1 tablet by mouth daily.   montelukast (SINGULAIR) 10 MG tablet Take 1 tablet (10 mg total) by mouth at bedtime.      Medical Decision Making  Patient will be admitted to [facility] for continuous assessment with follow up by psychiatry.   Patient is under IVC, attempted to call IVC petitioner for but no  answer, First exam was not completed, pending collateral information from IVC petitioner.     Recommendations  Based on my evaluation the patient does not appear to have an emergency medical condition.  Maricela Bo, NP 05/09/22  7:32 AM

## 2022-05-09 NOTE — ED Notes (Signed)
Pt A&O x 4, under IVC by girlfriend, presents anxious, irritable and labile.  Medically cleared by [facility] for frequent falls, pt rambling about being under IVC by girlfriend and son.  Denies SI,  or AVH.  Monitoring for safety.

## 2022-05-09 NOTE — ED Notes (Signed)
Pt A&O x 4, resting at present.  No distress noted. Anxious, cooperative.   Monitoring for safety.
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
## 2022-05-09 NOTE — ED Triage Notes (Signed)
Pt has returned from ER by [employer] under IVC. Pt is emergent.

## 2022-05-09 NOTE — Progress Notes (Signed)
Received [name] this AM awake in his chair bed.He ate breakfast and waiting to see the provider with the hope of going home. He denied all of the psychiatric symptoms this AM. He stated his girlfriend [name] will come and pick him up today if he is allowed to leave. He denied feeling dizzy or having a headache.

## 2022-05-09 NOTE — Progress Notes (Signed)
LCSW Progress Note  [id number]   [name]  05/09/2022  3:29 PM    Inpatient Behavioral Health Placement  Pt meets inpatient criteria per Joaquin Courts, FNP . There are no available beds within [facility] system per [facility] Antoinette Cillo, RN. Referral was sent to the following facilities;    Destination  Service Provider Address Phone Fax  [facility]  [address], [place] [address] [phone]  [facility]  [address] [place] [address] [phone]  [facility]  [address] [place], [place] [address] [phone]  [facility]  [address], [place] [address] [phone]  [facility]  [address] [place], [place] [address] [phone]  [facility] [place] [address] [phone]  [facility]  [address] [place] [address] [phone]  [facility]  [address] [place] [address] [phone]  [facility]  [address] [place], [place] [address] [phone]  [facility]  [address] [place] [address] [phone]  [facility]  [address] [place] [address] [phone]  [facility]  [address], [place] [address] [phone]  [facility]  [address], [place] [address] [phone]  [facility]  [address], [place] [address] [phone]  [facility]  [address], [place] [address] [phone]  [facility]  [address], [place]  [address] [phone]  [facility]  [address], [place] [address] [phone]  [facility]  [address], [place] [address] [phone]  [facility]  [address], [place] [address] [phone]  [facility]  [address], [place] [address] [phone]  [facility]  [address] [place] [address] [phone]  [facility]  [address] [place] [address] [phone]  [facility]  [address] [place] [address] [phone]  [facility] [address]  [address], [place] [address] [phone]  [facility]  [address] [place] [address] [phone]  [facility]  [phone] [place] [address] [phone]  [facility]  [address] [place] [address] [phone]  [facility]  [address], [place] [address] [phone]  [facility]  [address] [place] [address] [phone]   Situation ongoing,  CSW will follow up.    [name], MSW, [facility] 05/09/2022 3:29 PM

## 2022-05-09 NOTE — ED Notes (Signed)
Pt sleeping at present, no distress noted.  Monitoring for safety. 

## 2022-05-09 NOTE — ED Notes (Signed)
Patient had rapid speech and thought.  Illogical and paranoid ideas.  Patient was encouraged to breathe and attempt to slow thoughts.  He was receptive to suggestions.  Patient became calmer and was then socializing with male peers.  He is calmer now after receiving 10mg  zyprexa as ordered.  Will continue to monitor and provide a safe environment.

## 2022-05-09 NOTE — ED Provider Notes (Signed)
[place] EMERGENCY DEPARTMENT AT [facility] Provider Note   CSN: [id number] Arrival date & time: 05/08/22  2249     History  Chief Complaint  Patient presents with   Medical Clearance    [name] is a 57 y.o. male.  The history is provided by the patient.  Fall The current episode started more than 1 week ago. The problem occurs rarely. The problem has been resolved. Pertinent negatives include no chest pain, no abdominal pain, no headaches and no shortness of breath. Nothing aggravates the symptoms. Nothing relieves the symptoms. He has tried nothing for the symptoms. The treatment provided no relief.       Home Medications Prior to Admission medications   Medication Sig Start Date End Date Taking? Authorizing Provider  cyclobenzaprine (FLEXERIL) 10 MG tablet 1/2- 1 tablet po QHS prn spasm 03/08/17   Tysinger, Kermit Balo, PA-C  DULoxetine (CYMBALTA) 30 MG capsule TAKE ONE CAPSULE BY MOUTH EVERY DAY 04/15/16   Ronnald Nian, MD  hydrOXYzine (VISTARIL) 25 MG capsule TAKE 1 CAPSULE(25 MG) BY MOUTH AT BEDTIME 11/27/16   Tysinger, Kermit Balo, PA-C  losartan-hydrochlorothiazide (HYZAAR) 100-12.5 MG tablet Take 1 tablet by mouth daily. 10/28/17   Tysinger, Kermit Balo, PA-C  mirtazapine (REMERON) 30 MG tablet  09/28/15   [provider]  montelukast (SINGULAIR) 10 MG tablet Take 1 tablet (10 mg total) by mouth at bedtime. 10/28/17   Tysinger, Kermit Balo, PA-C  naproxen (NAPROSYN) 500 MG tablet Take 1 tablet (500 mg total) by mouth 2 (two) times daily with a meal. 03/08/17   Tysinger, Kermit Balo, PA-C  QUEtiapine (SEROQUEL) 50 MG tablet Take 1 tablet (50 mg total) by mouth at bedtime. 02/25/17   Tysinger, Kermit Balo, PA-C      Allergies    Benadryl [diphenhydramine], Doxycycline, Penicillins, and Sulfa antibiotics    Review of Systems   Review of Systems  Constitutional:  Negative for fever.  Respiratory:  Negative for shortness of breath.   Cardiovascular:  Negative for chest  pain.  Gastrointestinal:  Negative for abdominal pain and vomiting.  Neurological:  Negative for seizures, syncope, speech difficulty, weakness and headaches.  All other systems reviewed and are negative.   Physical Exam Updated Vital Signs BP (!) 168/118   Pulse 92   Temp 98.2 F (36.8 C) (Oral)   Resp 16   Ht 5\' 9"  (1.753 m)   Wt 81 kg   SpO2 100%   BMI 26.37 kg/m  Physical Exam Vitals and nursing note reviewed.  Constitutional:      General: He is not in acute distress.    Appearance: Normal appearance. He is well-developed. He is not diaphoretic.  HENT:     Head: Normocephalic and atraumatic.     Nose: Nose normal.  Eyes:     Conjunctiva/sclera: Conjunctivae normal.     Pupils: Pupils are equal, round, and reactive to light.  Cardiovascular:     Rate and Rhythm: Normal rate and regular rhythm.     Pulses: Normal pulses.     Heart sounds: Normal heart sounds.  Pulmonary:     Effort: Pulmonary effort is normal.     Breath sounds: Normal breath sounds. No wheezing or rales.  Abdominal:     General: Bowel sounds are normal.     Palpations: Abdomen is soft.     Tenderness: There is no abdominal tenderness. There is no guarding or rebound.  Musculoskeletal:  General: Normal range of motion.     Cervical back: Normal range of motion and neck supple.  Skin:    General: Skin is warm and dry.     Capillary Refill: Capillary refill takes less than 2 seconds.  Neurological:     General: No focal deficit present.     Mental Status: He is alert and oriented to person, place, and time.     Deep Tendon Reflexes: Reflexes normal.  Psychiatric:        Mood and Affect: Mood normal.        Behavior: Behavior normal.     ED Results / Procedures / Treatments   Labs (all labs ordered are listed, but only abnormal results are displayed) Labs Reviewed - No data to display  EKG None  Radiology CT Head Wo Contrast  Result Date: 05/08/2022 CLINICAL DATA:  Trauma EXAM:  CT HEAD WITHOUT CONTRAST CT CERVICAL SPINE WITHOUT CONTRAST TECHNIQUE: Multidetector CT imaging of the head and cervical spine was performed following the standard protocol without intravenous contrast. Multiplanar CT image reconstructions of the cervical spine were also generated. RADIATION DOSE REDUCTION: This exam was performed according to the departmental dose-optimization program which includes automated exposure control, adjustment of the mA and/or kV according to patient size and/or use of iterative reconstruction technique. COMPARISON:  None Available. FINDINGS: CT HEAD FINDINGS Brain: No acute territorial infarction, hemorrhage or intracranial mass. The ventricles are nonenlarged. Vascular: No hyperdense vessels.  No unexpected calcification. Skull: Normal. Negative for fracture or focal lesion. Sinuses/Orbits: Mucosal thickening in the sinuses. Other: None CT CERVICAL SPINE FINDINGS Alignment: No subluxation.  Facet alignment is within normal limits. Skull base and vertebrae: No acute fracture. No primary bone lesion or focal pathologic process. Soft tissues and spinal canal: No prevertebral fluid or swelling. No visible canal hematoma. Disc levels: Mild multilevel degenerative change with mild disc space narrowing and osteophyte C4 through C7. Upper chest: Negative. Other: None IMPRESSION: 1. Negative non contrasted CT appearance of the brain. 2. Mild degenerative changes of the cervical spine. No acute osseous abnormality. Electronically Signed   By: Jasmine Pang M.D.   On: 05/08/2022 23:53   CT Cervical Spine Wo Contrast  Result Date: 05/08/2022 CLINICAL DATA:  Trauma EXAM: CT HEAD WITHOUT CONTRAST CT CERVICAL SPINE WITHOUT CONTRAST TECHNIQUE: Multidetector CT imaging of the head and cervical spine was performed following the standard protocol without intravenous contrast. Multiplanar CT image reconstructions of the cervical spine were also generated. RADIATION DOSE REDUCTION: This exam was  performed according to the departmental dose-optimization program which includes automated exposure control, adjustment of the mA and/or kV according to patient size and/or use of iterative reconstruction technique. COMPARISON:  None Available. FINDINGS: CT HEAD FINDINGS Brain: No acute territorial infarction, hemorrhage or intracranial mass. The ventricles are nonenlarged. Vascular: No hyperdense vessels.  No unexpected calcification. Skull: Normal. Negative for fracture or focal lesion. Sinuses/Orbits: Mucosal thickening in the sinuses. Other: None CT CERVICAL SPINE FINDINGS Alignment: No subluxation.  Facet alignment is within normal limits. Skull base and vertebrae: No acute fracture. No primary bone lesion or focal pathologic process. Soft tissues and spinal canal: No prevertebral fluid or swelling. No visible canal hematoma. Disc levels: Mild multilevel degenerative change with mild disc space narrowing and osteophyte C4 through C7. Upper chest: Negative. Other: None IMPRESSION: 1. Negative non contrasted CT appearance of the brain. 2. Mild degenerative changes of the cervical spine. No acute osseous abnormality. Electronically Signed   By: Adrian Prows.D.  On: 05/08/2022 23:53    Procedures Procedures    Medications Ordered in ED Medications - No data to display  ED Course/ Medical Decision Making/ A&P                             Medical Decision Making Patient sent from [facility] for a fall that happened more than a week ago   Amount and/or Complexity of Data Reviewed Independent Historian:     Details: Police see above  External Data Reviewed: notes.    Details: Previous notes reviewed  Radiology: ordered and independent interpretation performed.    Details: Negative head CT   Risk Risk Details: EXam and CT are benign and reassuring.  Patient is medically clear for [facility], called NP    Final Clinical Impression(s) / ED Diagnoses Final diagnoses:  Feared condition not demonstrated   Medical clearance for psychiatric admission   Return for intractable cough, coughing up blood, fevers > 100.4 unrelieved by medication, shortness of breath, intractable vomiting, chest pain, shortness of breath, weakness, numbness, changes in speech, facial asymmetry, abdominal pain, passing out, Inability to tolerate liquids or food, cough, altered mental status or any concerns. No signs of systemic illness or infection. The patient is nontoxic-appearing on exam and vital signs are within normal limits.  I have reviewed the triage vital signs and the nursing notes. Pertinent labs & imaging results that were available during my care of the patient were reviewed by me and considered in my medical decision making (see chart for details). After history, exam, and medical workup I feel the patient has been appropriately medically screened and is safe for discharge home. Pertinent diagnoses were discussed with the patient. Patient was given return precautions. Rx / DC Orders ED Discharge Orders     None         Colbert Curenton, MD 05/09/22 1610

## 2022-05-10 DIAGNOSIS — F312 Bipolar disorder, current episode manic severe with psychotic features: Secondary | ICD-10-CM | POA: Diagnosis not present

## 2022-05-10 LAB — PROLACTIN: Prolactin: 9.9 ng/mL (ref 3.6–25.2)

## 2022-05-10 LAB — POC SARS CORONAVIRUS 2 AG: SARSCOV2ONAVIRUS 2 AG: NEGATIVE

## 2022-05-10 MED ORDER — NICOTINE 21 MG/24HR TD PT24
21.0000 mg | MEDICATED_PATCH | Freq: Once | TRANSDERMAL | Status: AC
  Administered 2022-05-10: 21 mg via TRANSDERMAL
  Filled 2022-05-10: qty 1

## 2022-05-10 NOTE — Progress Notes (Signed)
CSW sent the negative COVID results to [facility].  CSW updated nursing to setup transport.   Care Team notified: Hillery Jacks, NP, Tyrell Antonio, RN    Maryjean Ka, MSW, [facility] 05/10/2022 3:21 PM

## 2022-05-10 NOTE — Progress Notes (Signed)
CSW spoke with Kriste Basque, Intake [facility] and informed that pt will not transfer until tomorrow due to IVC status and lack of transport. CSW informed bed will be held.   Maryjean Ka, MSW, [facility] 05/10/2022 5:38 PM

## 2022-05-10 NOTE — ED Notes (Signed)
Patient A&Ox4. Patient denies SI/HI and AVH. Denies intent to harm self/others when asked. Denies A/VH. Patient denies any physical complaints when asked. No acute distress noted. Support and encouragement provided. Routine safety checks conducted according to facility protocol. Encouraged patient to notify staff if thoughts of harm toward self or others arise. Patient verbalize understanding and agreement. Will continue to monitor for safety.

## 2022-05-10 NOTE — ED Notes (Signed)
Patient resting quietly in bed watching TV. Respirations equal and unlabored, skin warm and dry, NAD. No change in assessment or acuity. Routine safety checks conducted according to facility protocol. Will continue to monitor for safety.   

## 2022-05-10 NOTE — ED Notes (Signed)
Patient resting quietly in bed with eyes open. Respirations equal and unlabored, skin warm and dry, NAD. No change in assessment or acuity. Routine safety checks conducted according to facility protocol. Will continue to monitor for safety.   

## 2022-05-10 NOTE — ED Notes (Signed)
Pt sleeping at present, no distress noted.  Monitoring for safety. 

## 2022-05-10 NOTE — Discharge Instructions (Addendum)
Pt was accepted to [facility]; Primary Address:  [address], [address], [place], [place] [address] TODAY 05/10/2022 PENDING Negative COVID [phone]    Pt meets inpatient criteria per Hillery Jacks, NP   Attending Physician will be Dr. Franchot Gallo, MD   Report can be called to: - [phone]

## 2022-05-10 NOTE — ED Notes (Signed)
[employer] transport contacted and voicemail left.

## 2022-05-10 NOTE — ED Provider Notes (Addendum)
Behavioral Health Progress Note  Date and Time: 05/10/2022 9:41 AM Name: [name] MRN:  [id number]  Subjective:  " I was started on a mood stabilizer and I am feeling a lot better if you let me go, I wont break the law.'  Evaluation: [name]  is a 57 year old Caucasian male that presents under involuntary commitment due to mania.  He has a charted history of bipolar 1 disorder, generalized anxiety disorder, major depressive disorder and insomnia.   [name] observed sitting on the side of his bed.  Presents with a bright and pleasant affect.  Patient's hyperverbal pressured speech slightly disorganized but redirectable.  Continues to deny suicidal or homicidal ideations.  Denies auditory or visual hallucinations.  Chart reviewed UDS negative.  Reports he was followed by Lamarr Lulas  at [facility] and feels that his medications were not working. Patient is labile slightly disorganized but redirectable.  Continues to ramble and change topic during this assessment.  Per nursing staff patient has been compliant with medication.  He reports a good appetite.  States he is resting well throughout the night.  Will continue to recommend inpatient admission.   Diagnosis:  Final diagnoses:  Bipolar affective disorder, currently manic, severe, with psychotic features (HCC)    Total Time spent with patient: 15 minutes   Additional Social History:         Sleep: Fair  Appetite:  Fair  Current Medications:  Current Facility-Administered Medications  Medication Dose Route Frequency Provider Last Rate Last Admin   acetaminophen (TYLENOL) tablet 650 mg  650 mg Oral Q6H PRN Ajibola, Ene A, NP   650 mg at 05/10/22 0342   alum & mag hydroxide-simeth (MAALOX/MYLANTA) 200-200-20 MG/5ML suspension 30 mL  30 mL Oral Q4H PRN Ajibola, Ene A, NP   30 mL at 05/09/22 0430   divalproex (DEPAKOTE) DR tablet 500 mg  500 mg Oral Q12H Bing Neighbors, NP   500 mg at 05/10/22 0917   hydrOXYzine (ATARAX)  tablet 25 mg  25 mg Oral TID PRN Ajibola, Ene A, NP   25 mg at 05/09/22 2151   magnesium hydroxide (MILK OF MAGNESIA) suspension 30 mL  30 mL Oral Daily PRN Ajibola, Ene A, NP       OLANZapine zydis (ZYPREXA) disintegrating tablet 5 mg  5 mg Oral BID Bing Neighbors, NP   5 mg at 05/10/22 0917   traZODone (DESYREL) tablet 50 mg  50 mg Oral QHS PRN Ajibola, Ene A, NP   50 mg at 05/09/22 2151   Current Outpatient Medications  Medication Sig Dispense Refill   cetirizine (ZYRTEC) 10 MG tablet Take 10 mg by mouth daily.     DULoxetine (CYMBALTA) 30 MG capsule TAKE ONE CAPSULE BY MOUTH EVERY DAY (Patient taking differently: 60 mg.) 30 capsule 0   losartan-hydrochlorothiazide (HYZAAR) 100-12.5 MG tablet Take 1 tablet by mouth daily. 90 tablet 0   montelukast (SINGULAIR) 10 MG tablet Take 1 tablet (10 mg total) by mouth at bedtime. 90 tablet 0   QUEtiapine (SEROQUEL) 50 MG tablet Take 1 tablet (50 mg total) by mouth at bedtime. 30 tablet 2    Labs  Lab Results:  Admission on 05/09/2022  Component Date Value Ref Range Status   TSH 05/09/2022 1.127  0.350 - 4.500 uIU/mL Final   Comment: Performed by a 3rd Generation assay with a functional sensitivity of <=0.01 uIU/mL. Performed at [facility], [address] [place], [place] [address]  Alcohol, Ethyl (B) 05/09/2022 <10  <10 mg/dL Final   Comment: (NOTE) Lowest detectable limit for serum alcohol is 10 mg/dL.  For medical purposes only. Performed at [facility], [address] [place], [place] [address]    Hgb A1c MFr Bld 05/09/2022 5.4  4.8 - 5.6 % Final   Comment: (NOTE) Pre diabetes:          5.7%-6.4%  Diabetes:              >6.4%  Glycemic control for   <7.0% adults with diabetes    Mean Plasma Glucose 05/09/2022 108.28  mg/dL Final   Performed at [facility], [address] [place], [place] [address]   WBC 05/09/2022 7.6  4.0 - 10.5 K/uL Final   RBC 05/09/2022 5.07  4.22 - 5.81 MIL/uL Final    Hemoglobin 05/09/2022 15.3  13.0 - 17.0 g/dL Final   HCT 09/81/1914 42.1  39.0 - 52.0 % Final   MCV 05/09/2022 83.0  80.0 - 100.0 fL Final   MCH 05/09/2022 30.2  26.0 - 34.0 pg Final   MCHC 05/09/2022 36.3 (H)  30.0 - 36.0 g/dL Final   RDW 78/29/5621 11.7  11.5 - 15.5 % Final   Platelets 05/09/2022 346  150 - 400 K/uL Final   nRBC 05/09/2022 0.0  0.0 - 0.2 % Final   Neutrophils Relative % 05/09/2022 56  % Final   Neutro Abs 05/09/2022 4.2  1.7 - 7.7 K/uL Final   Lymphocytes Relative 05/09/2022 29  % Final   Lymphs Abs 05/09/2022 2.2  0.7 - 4.0 K/uL Final   Monocytes Relative 05/09/2022 11  % Final   Monocytes Absolute 05/09/2022 0.8  0.1 - 1.0 K/uL Final   Eosinophils Relative 05/09/2022 3  % Final   Eosinophils Absolute 05/09/2022 0.3  0.0 - 0.5 K/uL Final   Basophils Relative 05/09/2022 1  % Final   Basophils Absolute 05/09/2022 0.1  0.0 - 0.1 K/uL Final   Immature Granulocytes 05/09/2022 0  % Final   Abs Immature Granulocytes 05/09/2022 0.02  0.00 - 0.07 K/uL Final   Performed at [facility], [address], [place], [place] [address]   POC Amphetamine UR 05/09/2022 None Detected  NONE DETECTED (Cut Off Level 1000 ng/mL) Final   POC Secobarbital (BAR) 05/09/2022 None Detected  NONE DETECTED (Cut Off Level 300 ng/mL) Final   POC Buprenorphine (BUP) 05/09/2022 None Detected  NONE DETECTED (Cut Off Level 10 ng/mL) Final   POC Oxazepam (BZO) 05/09/2022 None Detected  NONE DETECTED (Cut Off Level 300 ng/mL) Final   POC Cocaine UR 05/09/2022 None Detected  NONE DETECTED (Cut Off Level 300 ng/mL) Final   POC Methamphetamine UR 05/09/2022 None Detected  NONE DETECTED (Cut Off Level 1000 ng/mL) Final   POC Morphine 05/09/2022 None Detected  NONE DETECTED (Cut Off Level 300 ng/mL) Final   POC Methadone UR 05/09/2022 None Detected  NONE DETECTED (Cut Off Level 300 ng/mL) Final   POC Oxycodone UR 05/09/2022 None Detected  NONE DETECTED (Cut Off Level 100 ng/mL) Final   POC Marijuana UR  05/09/2022 None Detected  NONE DETECTED (Cut Off Level 50 ng/mL) Final   Cholesterol 05/09/2022 168  0 - 200 mg/dL Final   Triglycerides 78/46/9629 77  <150 mg/dL Final   HDL 52/84/1324 40 (L)  >40 mg/dL Final   Total CHOL/HDL Ratio 05/09/2022 4.2  RATIO Final   VLDL 05/09/2022 15  0 - 40 mg/dL Final  LDL Cholesterol 05/09/2022 113 (H)  0 - 99 mg/dL Final   Comment:        Total Cholesterol/HDL:CHD Risk Coronary Heart Disease Risk Table                     Men   Women  1/2 Average Risk   3.4   3.3  Average Risk       5.0   4.4  2 X Average Risk   9.6   7.1  3 X Average Risk  23.4   11.0        Use the calculated Patient Ratio above and the CHD Risk Table to determine the patient's CHD Risk.        ATP III CLASSIFICATION (LDL):  <100     mg/dL   Optimal  161-096  mg/dL   Near or Above                    Optimal  130-159  mg/dL   Borderline  045-409  mg/dL   High  >811     mg/dL   Very High Performed at [facility], [address] [place], [place] [address]    Sodium 05/09/2022 132 (L)  135 - 145 mmol/L Final   Potassium 05/09/2022 3.6  3.5 - 5.1 mmol/L Final   Chloride 05/09/2022 95 (L)  98 - 111 mmol/L Final   CO2 05/09/2022 28  22 - 32 mmol/L Final   Glucose, Bld 05/09/2022 104 (H)  70 - 99 mg/dL Final   Glucose reference range applies only to samples taken after fasting for at least 8 hours.   BUN 05/09/2022 16  6 - 20 mg/dL Final   Creatinine, Ser 05/09/2022 1.00  0.61 - 1.24 mg/dL Final   Calcium 29/56/2130 8.8 (L)  8.9 - 10.3 mg/dL Final   Total Protein 86/57/8469 6.2 (L)  6.5 - 8.1 g/dL Final   Albumin 62/95/2841 3.7  3.5 - 5.0 g/dL Final   AST 32/44/0102 23  15 - 41 U/L Final   ALT 05/09/2022 39  0 - 44 U/L Final   Alkaline Phosphatase 05/09/2022 61  38 - 126 U/L Final   Total Bilirubin 05/09/2022 0.5  0.3 - 1.2 mg/dL Final   GFR, Estimated 05/09/2022 >60  >60 mL/min Final   Comment: (NOTE) Calculated using the CKD-EPI Creatinine Equation (2021)     Anion gap 05/09/2022 9  5 - 15 Final   Performed at [facility], [address], [place], [place] [address]  Admission on 05/02/2022, Discharged on 05/03/2022  Component Date Value Ref Range Status   WBC 05/02/2022 10.3  4.0 - 10.5 K/uL Final   RBC 05/02/2022 4.58  4.22 - 5.81 MIL/uL Final   Hemoglobin 05/02/2022 13.9  13.0 - 17.0 g/dL Final   HCT 64/40/3474 39.4  39.0 - 52.0 % Final   MCV 05/02/2022 86.0  80.0 - 100.0 fL Final   MCH 05/02/2022 30.3  26.0 - 34.0 pg Final   MCHC 05/02/2022 35.3  30.0 - 36.0 g/dL Final   RDW 25/95/6387 11.8  11.5 - 15.5 % Final   Platelets 05/02/2022 241  150 - 400 K/uL Final   nRBC 05/02/2022 0.0  0.0 - 0.2 % Final   Neutrophils Relative % 05/02/2022 72  % Final   Neutro Abs 05/02/2022 7.4  1.7 - 7.7 K/uL Final   Lymphocytes Relative 05/02/2022 17  % Final   Lymphs Abs 05/02/2022  1.7  0.7 - 4.0 K/uL Final   Monocytes Relative 05/02/2022 9  % Final   Monocytes Absolute 05/02/2022 0.9  0.1 - 1.0 K/uL Final   Eosinophils Relative 05/02/2022 2  % Final   Eosinophils Absolute 05/02/2022 0.2  0.0 - 0.5 K/uL Final   Basophils Relative 05/02/2022 0  % Final   Basophils Absolute 05/02/2022 0.0  0.0 - 0.1 K/uL Final   Immature Granulocytes 05/02/2022 0  % Final   Abs Immature Granulocytes 05/02/2022 0.02  0.00 - 0.07 K/uL Final   Performed at [facility], [address] [place], [place] [address]   Sodium 05/02/2022 128 (L)  135 - 145 mmol/L Final   Potassium 05/02/2022 3.7  3.5 - 5.1 mmol/L Final   Chloride 05/02/2022 90 (L)  98 - 111 mmol/L Final   CO2 05/02/2022 25  22 - 32 mmol/L Final   Glucose, Bld 05/02/2022 152 (H)  70 - 99 mg/dL Final   Glucose reference range applies only to samples taken after fasting for at least 8 hours.   BUN 05/02/2022 13  6 - 20 mg/dL Final   Creatinine, Ser 05/02/2022 1.20  0.61 - 1.24 mg/dL Final   Calcium 60/45/4098 9.4  8.9 - 10.3 mg/dL Final   Total Protein 11/91/4782 6.5  6.5 - 8.1 g/dL Final   Albumin  95/62/1308 3.7  3.5 - 5.0 g/dL Final   AST 65/78/4696 18  15 - 41 U/L Final   ALT 05/02/2022 26  0 - 44 U/L Final   Alkaline Phosphatase 05/02/2022 55  38 - 126 U/L Final   Total Bilirubin 05/02/2022 0.7  0.3 - 1.2 mg/dL Final   GFR, Estimated 05/02/2022 >60  >60 mL/min Final   Comment: (NOTE) Calculated using the CKD-EPI Creatinine Equation (2021)    Anion gap 05/02/2022 13  5 - 15 Final   Performed at [facility], [address] [place], [place] [address]    Blood Alcohol level:  Lab Results  Component Value Date   ETH <10 05/09/2022   ETH  08/27/2009    <5        LOWEST DETECTABLE LIMIT FOR SERUM ALCOHOL IS 5 mg/dL FOR MEDICAL PURPOSES ONLY    Metabolic Disorder Labs: Lab Results  Component Value Date   HGBA1C 5.4 05/09/2022   MPG 108.28 05/09/2022   No results found for: "PROLACTIN" Lab Results  Component Value Date   CHOL 168 05/09/2022   TRIG 77 05/09/2022   HDL 40 (L) 05/09/2022   CHOLHDL 4.2 05/09/2022   VLDL 15 05/09/2022   LDLCALC 113 (H) 05/09/2022   LDLCALC 136 (H) 02/19/2017    Therapeutic Lab Levels: No results found for: "LITHIUM" No results found for: "VALPROATE" No results found for: "CBMZ"  Physical Findings   PHQ2-9    Flowsheet Row Clinical Support from 02/19/2017 in [facility] Office Visit from 11/15/2015 in [facility]  PHQ-2 Total Score 0 6  PHQ-9 Total Score -- 14      Flowsheet Row ED from 05/09/2022 in [facility] Most recent reading at 05/09/2022  3:28 AM ED from 05/08/2022 in [facility] Emergency Department at [facility] Most recent reading at 05/08/2022 10:59 PM ED from 05/08/2022 in [facility] Most recent reading at 05/08/2022  6:18 PM  C-SSRS RISK CATEGORY No Risk No Risk No Risk        Musculoskeletal  Strength & Muscle Tone: within normal  limits Gait & Station: normal Patient leans: N/A  Psychiatric Specialty Exam   Presentation  General Appearance:  Appropriate for Environment  Eye Contact: Good  Speech: Pressured; Other (comment) (Rapid speech with flights of ideas)  Speech Volume: Increased  Handedness: Right   Mood and Affect  Mood: Labile  Affect: Full Range; Labile; Inappropriate (Patient required multiple attempts at redirection during the interview as he would become animated but and attempt to demonstrate what he was speaking about)   Thought Process  Thought Processes: Disorganized  Descriptions of Associations:Circumstantial (Flights of ideas, hyperreligiosity,grandiosity)  Orientation:Full (Time, Place and Person)  Thought Content:Scattered; Illogical  Diagnosis of Schizophrenia or Schizoaffective disorder in past: No  Duration of Psychotic Symptoms: Less than six months   Hallucinations:Hallucinations: None  Ideas of Reference:Percusatory (He states that his girlfriend is out to get him and take everything he has and have him locked way permanently in a facility)  Suicidal Thoughts:Suicidal Thoughts: No  Homicidal Thoughts:Homicidal Thoughts: No   Sensorium  Memory: Immediate Good; Recent Good; Remote Good  Judgment: Poor  Insight: Poor   Executive Functions  Concentration: Poor  Attention Span: Poor  Recall: Poor  Fund of Knowledge: Fair  Language: Fair   Psychomotor Activity  Psychomotor Activity: Psychomotor Activity: Restlessness   Assets  Assets: Communication Skills; Desire for Improvement; Financial Resources/Insurance; Social Support; Physical Health; Housing; Transportation   Sleep  Sleep: Sleep: Poor (Endorses has not slept well in over 10 days)   No data recorded  Physical Exam  Physical Exam Vitals and nursing note reviewed.  Cardiovascular:     Rate and Rhythm: Normal rate and regular rhythm.  Neurological:     Mental Status: He is alert and oriented to person, place, and time.  Psychiatric:        Mood  and Affect: Mood normal.        Behavior: Behavior normal.    Review of Systems  Constitutional: Negative.   Skin: Negative.   Psychiatric/Behavioral:  Positive for depression. The patient is nervous/anxious.   All other systems reviewed and are negative.  Blood pressure 116/81, pulse 76, temperature 98.2 F (36.8 C), temperature source Oral, resp. rate 17, SpO2 98 %. There is no height or weight on file to calculate BMI.  Treatment Plan Summary: Daily contact with patient to assess and evaluate symptoms and progress in treatment and Medication management  CSW to continue seeking inpatient admission  Continue Zyprexa 5 mg p.o. twice daily Continue Depakote 500 p.o. twice daily Continue trazodone 50 mg p.o. as needed nightly  Oneta Rack, NP 05/10/2022 9:41 AM

## 2022-05-10 NOTE — ED Notes (Signed)
Pt A&O x 4, no distress noted, calm & cooperative. Monitoring for safety. 

## 2022-05-10 NOTE — Progress Notes (Signed)
Pt was accepted to [facility]; Primary Address:  [address], [place], [place] [address] TODAY 05/10/2022 PENDING Negative COVID [phone]   Pt meets inpatient criteria per Hillery Jacks, NP  Attending Physician will be Dr. Franchot Gallo, MD  Report can be called to: - [phone]  Pt can arrive after: BED IS READY upon completion of PENDING item.  Care Team notified: Hillery Jacks, NP, Tyrell Antonio, RN   Kelton Pillar, LCSWA 05/10/2022 @ 1:08 PM

## 2022-05-11 DIAGNOSIS — F312 Bipolar disorder, current episode manic severe with psychotic features: Secondary | ICD-10-CM | POA: Diagnosis not present

## 2022-05-11 NOTE — ED Notes (Signed)
[facility] requested to [facility].  Pt is IVC.

## 2022-05-11 NOTE — ED Notes (Signed)
Pt awakened from sleep, angry and agitated demanding to speak with a physician.  Demanding to leave facility immediately.  Pt in bathroom slamming toilet at present.  NP Shalon Bobbitt at bedside to speak with pt.

## 2022-05-11 NOTE — ED Provider Notes (Signed)
FBC/OBS ASAP Discharge Summary  Date and Time: 05/11/2022 8:10 AM  Name: [name]  MRN:  [id number]   Discharge Diagnoses:  Final diagnoses:  Bipolar affective disorder, currently manic, severe, with psychotic features (HCC)   HPI: [name] is a 57 y.o. male with psychiatric history of depression and anxiety.  Patient initially presented to [facility] under IVC petition for mental health evaluation on 05/09/2022. Of note, patient had a syncopal episode on 05/02/22 at home. He was transferred by EMS to [facility] but patient eloped from [facility] prior to medical evaluation. He presented to [facility] today with ongoing complaint of headache, sensitivity to light, blurred vision and nausea. Patient was sent to [facility] from [facility] for medical clearance.  Patient returned to the [facility] admitted to the continuous assessment unit while awaiting inpatient psychiatric bed availability.  [name], 57 y.o., male patient seen face to face by this provider and chart reviewed on 05/11/22.  Patient reports a past psychiatric history of bipolar disorder and anxiety.  On admission UDS and EtOH are negative.  Subjective:   During evaluation [name] is served sitting in his bed awake.  He is in no acute distress.  He is casually dressed and makes good eye contact.  His speech is clear, coherent, at a normal rate and tone.  However he is hyperverbal and tangential.  His thought process is scattered.  He is very animated when talking.  He continues to be paranoid and a bit delusional.  Reports he had a dream last night that he was slamming the toilet and that he had kissed the patient that was laying next to him.  States he even asked the patient this morning did he kiss her and she said no.  Per nursing patient awoke from his sleep last night and was agitated and angry.  He was demanding to speak to physician, wanted to leave the facility, and went to the bathroom and slammed the toilet.  He was unaware that he was  actually in the bathroom slam the toilet.  He is denying SI/HI/AVH.  He does not appear to be responding to internal/external stimuli.  He is aware that he has been accepted to [facility] for inpatient admission.  He is agreeable.    Stay Summary:  Patient continues to meet criteria for inpatient admission.  Under involuntary commitment.  He has been accepted [facility] for [facility] psychiatric admission.  He will be transported via [facility] today.   Total Time spent with patient: 30 minutes  Past Psychiatric History: as documented in H&P Past Medical History: as documented in H&P Family History: as documentas documented in H&Ped in H&P Family Psychiatric History:  Social History: as documented in H&P Tobacco Cessation:  N/A, patient does not currently use tobacco products  Current Medications:  Current Facility-Administered Medications  Medication Dose Route Frequency Provider Last Rate Last Admin   acetaminophen (TYLENOL) tablet 650 mg  650 mg Oral Q6H PRN Ajibola, Ene A, NP   650 mg at 05/11/22 0313   alum & mag hydroxide-simeth (MAALOX/MYLANTA) 200-200-20 MG/5ML suspension 30 mL  30 mL Oral Q4H PRN Ajibola, Ene A, NP   30 mL at 05/11/22 0319   divalproex (DEPAKOTE) DR tablet 500 mg  500 mg Oral Q12H Bing Neighbors, NP   500 mg at 05/10/22 2127   hydrOXYzine (ATARAX) tablet 25 mg  25 mg Oral TID PRN Ajibola, Ene A, NP   25 mg at 05/10/22  2127   magnesium hydroxide (MILK OF MAGNESIA) suspension 30 mL  30 mL Oral Daily PRN Ajibola, Ene A, NP       nicotine (NICODERM CQ - dosed in mg/24 hours) patch 21 mg  21 mg Transdermal Once Oneta Rack, NP   21 mg at 05/10/22 1155   OLANZapine zydis (ZYPREXA) disintegrating tablet 5 mg  5 mg Oral BID Bing Neighbors, NP   5 mg at 05/10/22 2127   traZODone (DESYREL) tablet 50 mg  50 mg Oral QHS PRN Ajibola, Ene A, NP   50 mg at 05/10/22 2127   Current Outpatient Medications  Medication Sig Dispense Refill    cetirizine (ZYRTEC) 10 MG tablet Take 10 mg by mouth daily.     DULoxetine (CYMBALTA) 30 MG capsule TAKE ONE CAPSULE BY MOUTH EVERY DAY (Patient taking differently: 60 mg.) 30 capsule 0   losartan-hydrochlorothiazide (HYZAAR) 100-12.5 MG tablet Take 1 tablet by mouth daily. 90 tablet 0   montelukast (SINGULAIR) 10 MG tablet Take 1 tablet (10 mg total) by mouth at bedtime. 90 tablet 0   QUEtiapine (SEROQUEL) 50 MG tablet Take 1 tablet (50 mg total) by mouth at bedtime. 30 tablet 2    PTA Medications:  Facility Ordered Medications  Medication   acetaminophen (TYLENOL) tablet 650 mg   alum & mag hydroxide-simeth (MAALOX/MYLANTA) 200-200-20 MG/5ML suspension 30 mL   magnesium hydroxide (MILK OF MAGNESIA) suspension 30 mL   traZODone (DESYREL) tablet 50 mg   hydrOXYzine (ATARAX) tablet 25 mg   [COMPLETED] OLANZapine zydis (ZYPREXA) disintegrating tablet 10 mg   OLANZapine zydis (ZYPREXA) disintegrating tablet 5 mg   divalproex (DEPAKOTE) DR tablet 500 mg   nicotine (NICODERM CQ - dosed in mg/24 hours) patch 21 mg   PTA Medications  Medication Sig   DULoxetine (CYMBALTA) 30 MG capsule TAKE ONE CAPSULE BY MOUTH EVERY DAY (Patient taking differently: 60 mg.)   QUEtiapine (SEROQUEL) 50 MG tablet Take 1 tablet (50 mg total) by mouth at bedtime.   losartan-hydrochlorothiazide (HYZAAR) 100-12.5 MG tablet Take 1 tablet by mouth daily.   montelukast (SINGULAIR) 10 MG tablet Take 1 tablet (10 mg total) by mouth at bedtime.       02/19/2017    8:25 AM 11/15/2015   11:04 AM  Depression screen PHQ 2/9  Decreased Interest 0 3  Down, Depressed, Hopeless 0 3  PHQ - 2 Score 0 6  Altered sleeping  1  Tired, decreased energy  1  Change in appetite  2  Feeling bad or failure about yourself   2  Trouble concentrating  2  Moving slowly or fidgety/restless  0  Suicidal thoughts  0  PHQ-9 Score  14    Flowsheet Row ED from 05/09/2022 in [facility] Most recent reading at  05/09/2022  3:28 AM ED from 05/08/2022 in [facility] at [facility] Most recent reading at 05/08/2022 10:59 PM ED from 05/08/2022 in [facility] Most recent reading at 05/08/2022  6:18 PM  C-SSRS RISK CATEGORY No Risk No Risk No Risk       Musculoskeletal  Strength & Muscle Tone: within normal limits Gait & Station: normal Patient leans: N/A  Psychiatric Specialty Exam  Presentation  General Appearance:  Casual; Appropriate for Environment  Eye Contact: Good  Speech: Clear and Coherent (hyperverbal)  Speech Volume: Normal  Handedness: Right   Mood and Affect  Mood: Anxious; Labile  Affect: Congruent   Thought  Process  Thought Processes: Disorganized  Descriptions of Associations:Circumstantial  Orientation:Full (Time, Place and Person)  Thought Content:Scattered  Diagnosis of Schizophrenia or Schizoaffective disorder in past: No  Duration of Psychotic Symptoms: Less than six months   Hallucinations:Hallucinations: None  Ideas of Reference:Percusatory  Suicidal Thoughts:Suicidal Thoughts: No  Homicidal Thoughts:Homicidal Thoughts: No   Sensorium  Memory: Immediate Good; Recent Good; Remote Good  Judgment: Poor  Insight: Poor   Executive Functions  Concentration: Fair  Attention Span: Fair  Recall: Fair  Fund of Knowledge: Fair  Language: Fair   Psychomotor Activity  Psychomotor Activity: Psychomotor Activity: Normal   Assets  Assets: Physical Health; Resilience; Social Support   Sleep  Sleep: Sleep: Fair   No data recorded  Physical Exam  Physical Exam Vitals and nursing note reviewed.  Constitutional:      General: He is not in acute distress.    Appearance: He is well-developed.  HENT:     Head: Normocephalic and atraumatic.  Eyes:     Conjunctiva/sclera: Conjunctivae normal.  Cardiovascular:     Rate and Rhythm: Normal rate and regular rhythm.      Heart sounds: No murmur heard. Pulmonary:     Effort: Pulmonary effort is normal. No respiratory distress.     Breath sounds: Normal breath sounds.  Abdominal:     Palpations: Abdomen is soft.     Tenderness: There is no abdominal tenderness.  Musculoskeletal:        General: No swelling. Normal range of motion.     Cervical back: Normal range of motion and neck supple.  Skin:    General: Skin is warm and dry.     Capillary Refill: Capillary refill takes less than 2 seconds.     Coloration: Skin is not jaundiced or pale.  Neurological:     Mental Status: He is alert and oriented to person, place, and time.  Psychiatric:        Attention and Perception: Attention and perception normal.        Mood and Affect: Mood is anxious, depressed and elated. Affect is labile.        Speech: Speech is tangential.        Behavior: Behavior is cooperative.        Thought Content: Thought content is paranoid and delusional.        Cognition and Memory: Cognition normal.        Judgment: Judgment is impulsive.    Review of Systems  Constitutional: Negative.   HENT: Negative.    Eyes: Negative.   Respiratory: Negative.    Cardiovascular: Negative.   Musculoskeletal: Negative.   Skin: Negative.   Neurological: Negative.   Psychiatric/Behavioral:  The patient is nervous/anxious.    Blood pressure 128/88, pulse 89, temperature 98.7 F (37.1 C), temperature source Oral, resp. rate 18, SpO2 98 %. There is no height or weight on file to calculate BMI.  Disposition:   Discharge patient and transfer to [facility] for IP admission. IVC will remain in place he will be transported via safe transport.   Ardis Hughs, NP 05/11/2022, 8:10 AM

## 2022-05-11 NOTE — ED Notes (Signed)
Patient is sitting quietly on the unit by the ice machine, no distress noted with continue to monitor patient for safety.

## 2022-05-11 NOTE — ED Notes (Addendum)
Patient has complaint of headache, per chart patient was given tylenol for pain at 313 this morning.  I advised the patient that he was given acetaminophen at 313 for pain and that medication is only allowed every 6 hours. [name] stated he would wait until it was time for his morning medications.

## 2022-05-11 NOTE — ED Notes (Signed)
Pt sleeping at present, no distress noted.  Monitoring for safety. 

## 2022-05-19 NOTE — Discharge Summary (Signed)
 [facility] Psychiatry  Discharge Summary   IGNORE MENTAL STATUS and Other data if NOTE is in progress and UNSIGNED  Some content has been copy forwarded from others. I have read that content and agree with its conclusions unless otherwise identified. For readability I have not specifically identified all such content.  Discharge date and time: 05/19/2022 @ 1200 Discharge Service: Psychiatry (PSY) Discharge Attending Physician: Adine Bertin, MD Discharge Diagnosis: Principal Problem:   Bipolar disorder, current episode manic, severe (CMS-HCC) Resolved Problems:   * No resolved hospital problems. *   IDENTIFICATION [name]: 57 y.o. male Date of Birth: 02-Apr-1965  MRN: [id number]  Chief Concern Bipolar  Admission to Psychiatry  05/11/2022 12:43 PM by Attending: Adine Bertin, MD Diagnosis: Bipolar Code Status: Full Code  Initial Presentation per HPI on Admission as of 05/11/2022 12:43 PM   (Original Author's Statement) Patient is a 57 y.o. male with a history of bipolar 1 disorder, generalized anxiety disorder, major depressive disorder and insomnia who presents under involuntary commitment due to mania. He began having aggressive behavior, hyperreligious thought content, and grandiosity after a medication change.    Adine Bertin, MD Assessment and Plan as of 05/19/2022 ( LOS: 8 days )  I saw the patient today and reviewed the patient's EPIC chart, including recent provider notes, nursing notes, and medications, and reviewed the case via team meeting, and as necessary with individual team members.   Adine Bertin, MD Evaluation as of 05/19/2022:  Looking forward to getting out and helping others. Optimistic about the future. Reviewed meds and he has no cocerns.  Adine Bertin, MD Mental Status Exam: (ignore if not a signed note)  Lethality  There is no evidence of current suicidal or homicidal thoughts, threatening behavior,  self-harm, or active suicidal behavior. The patient has no intent to harm self or others.   Appearance:     Appears stated age, Well nourished, Well developed, and dressed appropriately   Motor:    No abnormal movements  Speech:     Regular rate and rhythm  Language:    Normal sentence construction  Mood:    Described as ready to go  Affect:    Mood congruent, pleasant  Thought process:    Flows easily and not distractable. Energetic and talkative but likely a baseline  Thought content:       Focused on events prior to admission  Perceptual disturbances:      Denies auditory and visual hallucinations, behavior not concerning for response to internal stimuli    Orientation:    Patient is alert and oriented  Attention:    Able to fully attend without fluctuations in consciousness  Concentration:    focused  Memory:    Immediate, short-term, long-term, and recall grossly intact   Fund of knowledge:     Consistent with level of education and development  Insight:      Impaired  Judgment:     Impaired  Impulse Control:    No current problems   Current Documented Status and Treatment Recent Nursing Observations   Hours of sleep overnight :     Patient Vitals for the past 12 hrs:  BP Temp Temp src Pulse Resp SpO2  05/19/22 0700 133/92 36.5 C (97.7 F) Tympanic 85 17 95 %    Labs: Lab Values During Admission: Recent Results (from the past 336 hour(s))  Valproic Acid Level   Collection Time: 05/15/22  6:04 AM  Result Value Ref  Range   Valproic Acid, Total 52.1 50.0 - 100.0 ug/mL  Basic Metabolic Panel   Collection Time: 05/18/22  6:10 AM  Result Value Ref Range   Sodium 134 134 - 145 mmol/L   Potassium 5.1 (H) 3.6 - 5.0 mmol/L   Chloride 101 98 - 109 mmol/L   CO2 26.0 22.0 - 30.0 mmol/L   Anion Gap 7 (L) 8 - 20 mmol/L   BUN 17 9 - 21 mg/dL   Creatinine 9.20 9.27 - 1.35 mg/dL   BUN/Creatinine Ratio 22 (H) 6 - 20   eGFR CKD-EPI (2021) Male >90 >=60 mL/min/1.23m2   Glucose 96  70 - 120 mg/dL   Calcium 9.0 8.6 - 89.3 mg/dL  Valproic Acid Level   Collection Time: 05/18/22  6:10 AM  Result Value Ref Range   Valproic Acid, Total 59.1 50.0 - 100.0 ug/mL  Valproic Acid Level   Collection Time: 05/19/22  5:35 AM  Result Value Ref Range   Valproic Acid, Total 74.3 50.0 - 100.0 ug/mL    Other Test Results: Imaging: No results found.  No results found for this visit on 05/11/22 (from the past 4464 hour(s)).  Allergies: Allergies  Allergen Reactions  . Penicillins Rash  . Sulfa (Sulfonamide Antibiotics) Rash     Discharge Medications:    Your Medication List     ASK your doctor about these medications    amlodipine 2.5 MG tablet Commonly known as: NORVASC Take 1 tablet (2.5 mg total) by mouth daily.   clonazePAM 1 MG tablet Commonly known as: KlonoPIN Take 1 tablet (1 mg total) by mouth two (2) times a day. For anxiety   clonazePAM 1 MG tablet Commonly known as: KlonoPIN Take 1 tablet (1 mg total) by mouth daily as needed for anxiety.   DULoxetine  30 MG capsule Commonly known as: CYMBALTA  Take 3 capsules (90 mg total) by mouth every morning.   losartan -hydroCHLOROthiazide 100-25 mg per tablet Commonly known as: HYZAAR Take 1 tablet by mouth daily.   metoPROLOL tartrate 25 MG tablet Commonly known as: Lopressor Take 1 tablet (25 mg total) by mouth two (2) times a day.   montelukast  10 mg tablet Commonly known as: SINGULAIR  Take 1 tablet (10 mg total) by mouth every evening.   ondansetron 4 MG disintegrating tablet Commonly known as: ZOFRAN-ODT Dissolve 1 tablet (4 mg total) in the mouth every eight (8) hours as needed for nausea.   QUEtiapine  50 MG tablet Commonly known as: SEROquel  Take 0.5 tablets (25 mg total) by mouth nightly.        Medications Discontinued During This Encounter  Medication Reason  . OLANZapine  (ZyPREXA ) tablet 5 mg   . hydrOXYzine  (ATARAX ) tablet 25 mg   . divalproex  (DEPAKOTE ) DR tablet 500 mg   .  divalproex  (DEPAKOTE ) DR tablet 500 mg     Metabolic Monitoring: Height: 175.3 cm (5' 9)  Last 5 Recorded Weights   05/11/22 1324  Weight: 80 kg (176 lb 6.4 oz)   BMI: Body mass index is 26.05 kg/m. Waist circumference:     Day of Discharge Treatment Team Meeting: A multidisciplinary team, including physician, APP, nursing, case management, social work, and recreational therapy, met today to review the patient's progress toward achieving treatment goals and to discuss final after-care planning needs in anticipation of the patient's pending discharge.   Hospital Course on Inpatient Unit: The patient was admitted to the [facility] inpatient unit in stable condition and started on routine 15 minute checks and routine safety  precautions, activity as tolerated and a regular diet unless otherwise noted.  The patient was offered the following resources during hospitalization: psychiatric physician and nursing services, clinical case management, social work support, recreational therapies, and medical consultation. The patient's case and treatment plan were discussed daily in multi-disciplinary team meeting. The patient had access to individual, group, and milieu therapeutic modalities. The treatment team provided the patient with psycho-education and made recommendations regarding . The patient was educated about relevant modifiable risk factors for treatment of their psychiatric illness.    As of 05/13/2022,  Met with the patient to assess bipolar symptoms.  Overall he is quite pressured in his speech rapidly changes from topic to topic.  Nonetheless it is also easy to understand and it appears that much of his concerns in terms of functioning on an outpatient basis are accurate.  He is able to her describe the value of medications and feels that the current medications are appropriate and helping him.  He states that he on average sleeps about 5 hours a night and that recently he has been unable to sleep and  this was noted to have a part of his worsening.  Here he has been able to attend groups during the day and I continue to reinforce the value of being active and engaged during the day including some of the outdoor activities as well as participating with the team and that while hopefully help him sleep better at night along with the medication changes.  Overall he is on good trajectory in terms of addressing the mania.  My main concern is at times he becomes somewhat tearful during the interview.  Some of this is quite logical given the worse more recent problems nonetheless it is important to be aware of the potential risk of cycling back to a depressive state we will continue to follow closely    5/2 Identified a desire to deal with stress management. More calm today until the discussion of discharge came up. Then he became most angry and said we would keep him here forever against his will. Unable to explain that we would work toegther for a date. He eventually walked away. Still with poor sleep. States this is the first time off benzos. Crying easily during interview.   5/3 States he is doing well with current meds. Overall calm and pleasant. Still pacing at times and per nursing somewhat intrusive. Mania resolving   5/4 I realized I have the same thing as my sister. Bipolar. I don't sleep well. Naps during the day are helping him recover. States our gears never stop turning  Asked me how he can interrupt. Depakote  at 500BID likely not sufficient. Discussed a drawing he made recently. Given 80kg and standard 25mg /kg dosing the proper dose is likely 2000mg  per day. Given current low dose there is no need to check level at this time. He described some nausea so we will convert to TID schedule and then considering adding additional to morning and evening dose   5/5 More calm. Highs and lows of roller coaster are decreasing. This is some of the best I've been in my life - presented in a positive not  manic way. Meaning good appetite. Still somewhat pressured. Extra pill in the morning made him sleepy. Had rough dreams last night. To consider moving the midday dose to nighttime. 59.1 level is exactly where I want it to be. It may go a bit higher.   5/6 Some nightmares still but slightly better.  Pt requested higher level but we are going to stay on 1 mg. I explained that there would be a hearing or he could consider being a voluntary patient. He said he would throw the dice and see if the judge would let him go tomorrow. Tolerating meds and mania is slowly improving.   Psychiatric diagnostic clarification was achieved during admission through serial mental status examinations, behavioral observations, review of collateral history, use of psychometric rating scales as appropriate, and review of old records. To treat associated symptoms, the patient and the team had a discussion regarding medication and therapy options. There were no reported or observed side effects, including akathisia, tremor, or tardive dyskinesia unless otherwise noted.  Medications were tolerated without significant adverse effect unless otherwise noted. Discharge medications are as below.  Psychoeducation about diagnosis and the recommended treatment were provided.  Physical restraints nor intramuscular medication were required during admission unless otherwise noted.     The patient was monitored throughout admission for physical complaints, including potential medication side effects. The medical team was consulted for routine history and physical unless otherwise noted.  Social work and therapy services were consulted for psychosocial evaluation and treatment and discharge safety planning.  See pertinent lab values below.    Gradually with implemented medication changes and with the use of individual, group, and milieu therapeutic modalities, there was improvement noted in the patient's symptoms. See below for daily summaries of  the patient's hospital course.    On the day of discharge, the patient was evaluated by the attending MD and was discussed with the multidisciplinary treatment team. A day of discharge assessment included a suicide risk and violence risk assessment. At the time of discharge, the treatment team determined that the patient was stable, posed no acute risk of dangerousness to self or others, and was appropriate for discharge. A follow-up plan and crisis plan were put in place and were discussed with the patient. Safety plan was coordinated via care coordination.  While future psychiatric events cannot be accurately predicted, the patient was no longer felt to require further acute inpatient psychiatric care and did not meet [place]  involuntary commitment criteria on the day of discharge. Further, the patient is assessed to be appropriate for discharge from the inpatient setting as condition has reached the point where continued care, diagnostic refinement, and treatment can be reasonably and safely performed in a lesser restrictive setting. It was recommended that the patient continue outpatient mental health treatment in the community as well as routine medical health care in the community after discharge with appointments scheduled. Bridging prescriptions for needed medications were provided until the patient can be see for hospital discharge follow-up appointments. See plan below for details regarding scheduled after-care appointments and prescriptions provided. The patient agreed to the discussed after-care plans and time was given for patient questions.    RISK ASSESSMENT: 1.  Risk of Harm to Self:  The patient is at a chronically increased risk of suicide as demonstrated by the history of previous suicidal gestures, personality structure, and history of low stress tolerance. However, the patient does not appear to be at an acutely increased risk of suicide currently and is clearly and convincingly  denying suicidal intent. As such, there is no indication that further  inpatient admission will be of benefit.  Discussed safety plan w pt.  Discussed modifiable risk factors, including refraining from substance use and limiting access to means of self harm.  Discussed when/how to seek immediate care.  Pt  agreeable to enacting safety plan should SI worsen.  2.  Risk of Harm to Others:  At the time of discharge, the patient had a low risk of imminent harm to others.  The patient denied any active homicidal ideation, intention or planning.  The patient exhibited no violent behavior while in the hospital.  There is no reported or confirmed history of proactive or reactive aggression.  However, the patient is at a chronically elevated risk of harm to others given history of chronic and persistent mental illness.  Risk has been mitigated by the prescription of appropriate, evidenced-based medications for the given diagnosis and by allowing the patient to achieve stability prior to discharge.  We have also monitored for safety on the inpatient unit, provided a therapeutic milieu and unit programming, and we have appropriate follow up plans in place. 3.  Assessment of [place] Disability:  At the time of discharge, the patient had a low risk of inability to care for self.  The patient exhibited no delirium, confusion, or psychosis, and was observed to attend to basic activities of daily living including nutrition, hydration, and hygiene.  Further, the patient has a demonstrated history of managing daily essential needs when well mentally and at or near baseline level of functioning.      Discharge to: Home   Condition at Discharge: Improved. Pt at baseline per girlfriend   Risk State at Discharge: (Relative to baseline for patient): Low   PLAN:   Appointments which have been scheduled for you   For your appointment with [facility]: You will receive an email from [facility] to  [email or web address] containing information about your appointment. They recommend that you fill out your new patient paperwork prior to the day of your appointment. Please arrive 30 minutes prior to your scheduled appointment time in order to fill out new patient paperwork. After your initial appointment with the medication provider, you can schedule an appointment with a therapist through [facility].  If you feel like you need to be seen by a mental health professional prior to your appointment with [facility] on 05/28/22, please go to the [facility]   - Phone: [phone]   - Address: [address] [place], [place] [address]    - Hours: Open 24/7, No appointment required       Outpatient psychiatric and medical follow-up appointments were arranged as noted below. The need for continued mental health treatment and routine medical care in the community after hospital discharge were stressed to the patient, and compliance with these follow-up appointments was encouraged.    The patient was made aware that all subsequent medication refills would need to be handled by the outpatient provider.    The patient was provided an AVS outlining hospital follow-up appointments and medications to take after discharge.   The patient was advised to continue taking all discharge medications as prescribed, to not make changes to medications, and to not abruptly discontinue medications without risk of acute decompensation or side-effects.    National Suicide Prevention Lifeline numbers, the Inpatient Unit number, and the local Mobile Crisis number were provided to the patient at the time of discharge. The patient was advised to call Mobile Crisis, to call 911, or to go to the closest Emergency Department for any increase in symptoms, new psychiatric symptoms, acute safety concerns including thoughts of  harm toward self or others,  increased agitation, medication side-effects, or other immediate concerns.    Firearms were requested to be removed from the home prior to discharge.   If you smoke, it is vitally important to stop smoking given potential risk for heart and lung disease, including cancer. Children and adults should avoid secondhand smoke. Please talk with your Doctor about getting help with quitting. Many resources now exist to help you.   Please remain free from any mood- or mind-altering substances such as alcohol, marijuana, stimulants, or hallucinogens which have potential to worsen or even cause psychiatric illness.     The need for necessary lab monitoring while was stressed to the patient at the time of discharge. The patient was encouraged to coordinate with the outpatient provider for continued lab monitoring.    Hospital discharge summary, discharge med list, and lab results are to be cced to the patient's outpatient mental health and medical provider for continuity of care at time of discharge with the patient's written consent.  Please bring a copy of your hospital discharge instructions to all medical appointment including your follow up appointment with your primary care physician or psychiatrist.   Please remember to take all your medications as prescribed, and call your doctor if you experience any adverse side effects or have need for refill.    Please keep all medications out of the reach of children.  For patients with history of suicidal ideation, it is recommended to keep medications locked away to help prevent overdose.   If your medications are sedating, do not drive, do not climb, do not operate heavy machinery, and avoid situations where there is a risk of drowning unless you have cleared these actions with your doctor.   It is possible that physical or mental health can affect one's ability to make sound decisions. Consider asking someone you trust (preferably a  relative) to make decisions in case this happens by giving them medical power of attorney or asking them to be your authorized representative, who is a person who can make temporary decisions on your behalf.    If you have symptoms of severely elevated mood or begin feeling increased energy without the need to sleep at night, please alert your doctor immediately.   Social rhythms (ie. Daily routine/structure) has been shown to have positive impacts on one's general sense of wellbeing and may also reduce the symptoms of numerous mental health diagnoses.  Maintaining sleep hygiene, healthy eating, and low-impact physical activity (such as walking) are important parts of this routine.  Further, appropriate diet and exercise may decrease the potential for weight gain as a side effect to prescribed medication.  Please refer to the appendices of the New Directions manual that you were provided upon admission for more detail, or visit [email or web address].org/.   Greater than 30 minutes was spent on this discharge including coordinating follow up, providing instructions and education to patient, and facilitating physical discharge in coordination with nursing and other unit staff   Adine Bertin, MD, Attending Psychiatrist Pager [phone]  Note Completed: 05/19/2022 as of 8:14 AM

## 2023-11-09 ENCOUNTER — Emergency Department (HOSPITAL_COMMUNITY)
Admission: EM | Admit: 2023-11-09 | Discharge: 2023-11-10 | Disposition: A | Discharge status: Other Acute Inpatient Hospital | Payer: Self-pay | Attending: Emergency Medicine | Admitting: Emergency Medicine

## 2023-11-09 ENCOUNTER — Other Ambulatory Visit: Payer: Self-pay

## 2023-11-09 DIAGNOSIS — T6591XA Toxic effect of unspecified substance, accidental (unintentional), initial encounter: Secondary | ICD-10-CM

## 2023-11-09 DIAGNOSIS — F410 Panic disorder [episodic paroxysmal anxiety] without agoraphobia: Secondary | ICD-10-CM

## 2023-11-09 DIAGNOSIS — F411 Generalized anxiety disorder: Secondary | ICD-10-CM | POA: Insufficient documentation

## 2023-11-09 DIAGNOSIS — F3130 Bipolar disorder, current episode depressed, mild or moderate severity, unspecified: Secondary | ICD-10-CM | POA: Insufficient documentation

## 2023-11-09 DIAGNOSIS — T65292A Toxic effect of other tobacco and nicotine, intentional self-harm, initial encounter: Secondary | ICD-10-CM | POA: Insufficient documentation

## 2023-11-09 DIAGNOSIS — T1491XA Suicide attempt, initial encounter: Secondary | ICD-10-CM

## 2023-11-09 LAB — COMPREHENSIVE METABOLIC PANEL WITH GFR
ALT: 43 U/L (ref 0–44)
AST: 21 U/L (ref 15–41)
Albumin: 2.9 g/dL — ABNORMAL LOW (ref 3.5–5.0)
Alkaline Phosphatase: 74 U/L (ref 38–126)
Anion gap: 10 (ref 5–15)
BUN: 10 mg/dL (ref 6–20)
CO2: 25 mmol/L (ref 22–32)
Calcium: 8.6 mg/dL — ABNORMAL LOW (ref 8.9–10.3)
Chloride: 101 mmol/L (ref 98–111)
Creatinine, Ser: 0.96 mg/dL (ref 0.61–1.24)
GFR, Estimated: >60 mL/min (ref >60)
Glucose, Bld: 109 mg/dL — ABNORMAL HIGH (ref 70–99)
Potassium: 3.7 mmol/L (ref 3.5–5.1)
Sodium: 136 mmol/L (ref 135–145)
Total Bilirubin: 0.2 mg/dL (ref 0.0–1.2)
Total Protein: 5.9 g/dL — ABNORMAL LOW (ref 6.5–8.1)

## 2023-11-09 LAB — URINALYSIS, ROUTINE W REFLEX MICROSCOPIC
Bacteria, UA: NONE SEEN
Bilirubin Urine: NEGATIVE
Glucose, UA: NEGATIVE mg/dL
Hgb urine dipstick: NEGATIVE
Ketones, ur: NEGATIVE mg/dL
Leukocytes,Ua: NEGATIVE
Nitrite: NEGATIVE
Protein, ur: 100 mg/dL — AB
Specific Gravity, Urine: 1.008 (ref 1.005–1.030)
pH: 8 (ref 5.0–8.0)

## 2023-11-09 LAB — CBC WITH DIFFERENTIAL/PLATELET
Abs Immature Granulocytes: 0.07 K/uL (ref 0.00–0.07)
Basophils Absolute: 0.1 K/uL (ref 0.0–0.1)
Basophils Relative: 1 %
Eosinophils Absolute: 0.1 K/uL (ref 0.0–0.5)
Eosinophils Relative: 2 %
HCT: 37.8 % — ABNORMAL LOW (ref 39.0–52.0)
Hemoglobin: 12.8 g/dL — ABNORMAL LOW (ref 13.0–17.0)
Immature Granulocytes: 1 %
Lymphocytes Relative: 15 %
Lymphs Abs: 1.1 K/uL (ref 0.7–4.0)
MCH: 30.3 pg (ref 26.0–34.0)
MCHC: 33.9 g/dL (ref 30.0–36.0)
MCV: 89.6 fL (ref 80.0–100.0)
Monocytes Absolute: 0.6 K/uL (ref 0.1–1.0)
Monocytes Relative: 8 %
Neutro Abs: 5.6 K/uL (ref 1.7–7.7)
Neutrophils Relative %: 73 %
Platelets: 328 K/uL (ref 150–400)
RBC: 4.22 MIL/uL (ref 4.22–5.81)
RDW: 13.2 % (ref 11.5–15.5)
WBC: 7.5 K/uL (ref 4.0–10.5)
nRBC: 0 % (ref 0.0–0.2)

## 2023-11-09 LAB — I-STAT CHEM 8, ED
BUN: 10 mg/dL (ref 6–20)
Calcium, Ion: 1.08 mmol/L — ABNORMAL LOW (ref 1.15–1.40)
Chloride: 99 mmol/L (ref 98–111)
Creatinine, Ser: 1 mg/dL (ref 0.61–1.24)
Glucose, Bld: 108 mg/dL — ABNORMAL HIGH (ref 70–99)
HCT: 35 % — ABNORMAL LOW (ref 39.0–52.0)
Hemoglobin: 11.9 g/dL — ABNORMAL LOW (ref 13.0–17.0)
Potassium: 3.7 mmol/L (ref 3.5–5.1)
Sodium: 134 mmol/L — ABNORMAL LOW (ref 135–145)
TCO2: 24 mmol/L (ref 22–32)

## 2023-11-09 LAB — RAPID URINE DRUG SCREEN, HOSP PERFORMED
Amphetamines: NOT DETECTED
Barbiturates: NOT DETECTED
Benzodiazepines: NOT DETECTED
Cocaine: NOT DETECTED
Opiates: NOT DETECTED
Tetrahydrocannabinol: NOT DETECTED

## 2023-11-09 LAB — ACETAMINOPHEN LEVEL: Acetaminophen (Tylenol), Serum: <10 ug/mL — ABNORMAL LOW (ref 10–30)

## 2023-11-09 LAB — SALICYLATE LEVEL: Salicylate Lvl: <7 mg/dL — ABNORMAL LOW (ref 7.0–30.0)

## 2023-11-09 LAB — ETHANOL: Alcohol, Ethyl (B): <15 mg/dL (ref <15)

## 2023-11-09 MED ORDER — DIPHENHYDRAMINE HCL 50 MG/ML IJ SOLN
25.0000 mg | Freq: Once | INTRAMUSCULAR | Status: AC
  Administered 2023-11-09: 25 mg via INTRAVENOUS
  Filled 2023-11-09: qty 1

## 2023-11-09 MED ORDER — FOLIC ACID 1 MG PO TABS
1.0000 mg | ORAL_TABLET | Freq: Every day | ORAL | Status: DC
Start: 2023-11-10 — End: 2023-11-10
  Administered 2023-11-10: 1 mg via ORAL
  Filled 2023-11-09: qty 1

## 2023-11-09 MED ORDER — THIAMINE MONONITRATE 100 MG PO TABS
100.0000 mg | ORAL_TABLET | Freq: Every day | ORAL | Status: DC
  Administered 2023-11-10: 100 mg via ORAL
  Filled 2023-11-09: qty 1

## 2023-11-09 MED ORDER — LORAZEPAM 1 MG PO TABS
2.0000 mg | ORAL_TABLET | Freq: Once | ORAL | Status: AC
  Administered 2023-11-09: 2 mg via ORAL
  Filled 2023-11-09: qty 2

## 2023-11-09 MED ORDER — THIAMINE HCL 100 MG/ML IJ SOLN
100.0000 mg | Freq: Every day | INTRAMUSCULAR | Status: DC
Start: 2023-11-10 — End: 2023-11-10

## 2023-11-09 MED ORDER — LORAZEPAM 1 MG PO TABS
1.0000 mg | ORAL_TABLET | ORAL | Status: DC | PRN
  Administered 2023-11-09 – 2023-11-10 (×2): 1 mg via ORAL
  Filled 2023-11-09 (×2): qty 1

## 2023-11-09 MED ORDER — METOCLOPRAMIDE HCL 5 MG/ML IJ SOLN
10.0000 mg | Freq: Once | INTRAMUSCULAR | Status: AC
  Administered 2023-11-09: 10 mg via INTRAVENOUS
  Filled 2023-11-09: qty 2

## 2023-11-09 MED ORDER — LORAZEPAM 2 MG/ML IJ SOLN: 1.0000 mg | INTRAMUSCULAR | Status: DC | PRN

## 2023-11-09 MED ORDER — ADULT MULTIVITAMIN W/MINERALS CH
1.0000 | ORAL_TABLET | Freq: Every day | ORAL | Status: DC
  Administered 2023-11-10: 1 via ORAL
  Filled 2023-11-09: qty 1

## 2023-11-09 NOTE — ED Notes (Signed)
 Spoke with poison control and gave an update on the pt's condition. Pt resting in bed and has had resolution of most symptoms.

## 2023-11-09 NOTE — ED Provider Notes (Signed)
 [place] EMERGENCY DEPARTMENT AT [facility] Provider Note   CSN: [id number] Arrival date & time: 11/09/23  1554     Patient presents with: Suicidal   [name] is a 58 y.o. male.   58 yo M with hx of anxiety, major depressive disorder, bipolar disorder, and alcohol use who presents to the emergency department after drinking 60 mL of liquid nicotine  at 1555.  Girlfriend reports that he has multiple history of suicide attempts and believe that this was another suicide attempt.  When asked the patient why he did this he says it was not self-harm but says that he wanted to get high from it.  EMS contacted poison control.  No interventions and route.  Feeling very anxious because he ran out of his clonazepam recently       Prior to Admission medications   Medication Sig Start Date End Date Taking? Authorizing Provider  cetirizine (ZYRTEC) 10 MG tablet Take 10 mg by mouth daily.    [provider]  DULoxetine  (CYMBALTA ) 30 MG capsule TAKE ONE CAPSULE BY MOUTH EVERY DAY Patient taking differently: 60 mg. 04/15/16   Lalonde, John C, MD  losartan -hydrochlorothiazide (HYZAAR) 100-12.5 MG tablet Take 1 tablet by mouth daily. 10/28/17   Tysinger, Alm RAMAN, PA-C  montelukast  (SINGULAIR ) 10 MG tablet Take 1 tablet (10 mg total) by mouth at bedtime. 10/28/17   Tysinger, Alm RAMAN, PA-C  QUEtiapine  (SEROQUEL ) 50 MG tablet Take 1 tablet (50 mg total) by mouth at bedtime. 02/25/17   Tysinger, Alm RAMAN, PA-C    Allergies: Benadryl [diphenhydramine], Doxycycline, Penicillins, and Sulfa antibiotics    Review of Systems  Updated Vital Signs BP (!) 147/86 (BP Location: Right Arm)   Pulse 75   Temp 98.4 F (36.9 C) (Oral)   Resp 18   SpO2 100%   Physical Exam Vitals and nursing note reviewed.  Constitutional:      General: He is not in acute distress.    Appearance: He is well-developed.     Comments: Somewhat anxious.  HENT:     Head: Normocephalic and atraumatic.      Right Ear: External ear normal.     Left Ear: External ear normal.     Nose: Nose normal.     Mouth/Throat:     Mouth: Mucous membranes are moist.     Pharynx: Oropharynx is clear.  Eyes:     Extraocular Movements: Extraocular movements intact.     Conjunctiva/sclera: Conjunctivae normal.     Pupils: Pupils are equal, round, and reactive to light.     Comments: Pupils 5 mm and reactive bilaterally  Cardiovascular:     Rate and Rhythm: Normal rate and regular rhythm.     Heart sounds: Normal heart sounds.  Pulmonary:     Effort: Pulmonary effort is normal. No respiratory distress.     Breath sounds: Normal breath sounds.  Abdominal:     General: There is no distension.     Palpations: Abdomen is soft. There is no mass.     Tenderness: There is no abdominal tenderness. There is no guarding.  Musculoskeletal:     Cervical back: Normal range of motion and neck supple.     Right lower leg: No edema.     Left lower leg: No edema.  Skin:    General: Skin is warm and dry.  Neurological:     Mental Status: He is alert and oriented to person, place, and time. Mental status is at  baseline.     Cranial Nerves: No cranial nerve deficit.     Sensory: No sensory deficit.     Motor: No weakness.     Comments: No ankle clonus.  Patellar reflexes 2+ bilaterally.  Psychiatric:        Mood and Affect: Mood normal.        Behavior: Behavior normal.     (all labs ordered are listed, but only abnormal results are displayed) Labs Reviewed  COMPREHENSIVE METABOLIC PANEL WITH GFR - Abnormal; Notable for the following components:      Result Value   Glucose, Bld 109 (*)    Calcium 8.6 (*)    Total Protein 5.9 (*)    Albumin 2.9 (*)    All other components within normal limits  CBC WITH DIFFERENTIAL/PLATELET - Abnormal; Notable for the following components:   Hemoglobin 12.8 (*)    HCT 37.8 (*)    All other components within normal limits  SALICYLATE LEVEL - Abnormal; Notable for the  following components:   Salicylate Lvl <7.0 (*)    All other components within normal limits  ACETAMINOPHEN  LEVEL - Abnormal; Notable for the following components:   Acetaminophen  (Tylenol ), Serum <10 (*)    All other components within normal limits  URINALYSIS, ROUTINE W REFLEX MICROSCOPIC - Abnormal; Notable for the following components:   Protein, ur 100 (*)    All other components within normal limits  I-STAT CHEM 8, ED - Abnormal; Notable for the following components:   Sodium 134 (*)    Glucose, Bld 108 (*)    Calcium, Ion 1.08 (*)    Hemoglobin 11.9 (*)    HCT 35.0 (*)    All other components within normal limits  ETHANOL  RAPID URINE DRUG SCREEN, HOSP PERFORMED  RAPID URINE DRUG SCREEN, HOSP PERFORMED  CBG MONITORING, ED    EKG: EKG Interpretation Date/Time:  Tuesday November 09 2023 16:12:26 EDT Ventricular Rate:  85 PR Interval:  154 QRS Duration:  84 QT Interval:  380 QTC Calculation: 452 R Axis:   -7  Text Interpretation: Normal sinus rhythm Possible Left atrial enlargement Minimal voltage criteria for LVH, may be normal variant ( R in aVL ) Borderline ECG When compared with ECG of 09-May-2022 01:36, PREVIOUS ECG IS PRESENT Confirmed by Yolande Charleston [phone]) on 11/09/2023 4:21:31 PM  Radiology: No results found.   Procedures   Medications Ordered in the ED  LORazepam (ATIVAN) tablet 1-4 mg (1 mg Oral Given 11/09/23 2259)    Or  LORazepam (ATIVAN) injection 1-4 mg ( Intravenous See Alternative 11/09/23 2259)  thiamine (VITAMIN B1) tablet 100 mg (has no administration in time range)    Or  thiamine (VITAMIN B1) injection 100 mg (has no administration in time range)  folic acid (FOLVITE) tablet 1 mg (has no administration in time range)  multivitamin with minerals tablet 1 tablet (has no administration in time range)  LORazepam (ATIVAN) tablet 2 mg (2 mg Oral Given 11/09/23 1641)  metoCLOPramide (REGLAN) injection 10 mg (10 mg Intravenous Given 11/09/23  1911)  diphenhydrAMINE (BENADRYL) injection 25 mg (25 mg Intravenous Given 11/09/23 1911)    Clinical Course as of 11/10/23 0024  Tue Nov 09, 2023  1620 IVC paperwork filled out and given to diplomatic services operational officer [RP]  1803 Lindie from poison control contacted. Feels that pt only needs symptomatic care and to be observed for 6 hours. If he has any lingering symptoms. Give atropine for any anticholinergic effects.  [RP]  1933 Patient  had episode of vomiting and was given Reglan. [RP]  2213 Reassessed.  No new symptoms.  Tolerating p.o.  He is overall well-appearing.  Medically cleared. [RP]    Clinical Course User Index [RP] Yolande Lamar BROCKS, MD                                 Medical Decision Making Amount and/or Complexity of Data Reviewed Labs: ordered.  Risk OTC drugs. Prescription drug management.   58 yo M with hx of anxiety, major depressive disorder, bipolar disorder, and alcohol use who presents to the emergency department after drinking 60 mL of liquid nicotine  at 1555.   Initial Ddx:  Nicotine  intoxication, nausea and vomiting, seizures, Tylenol /salicylate ingestion, suicide attempt  MDM/Course:  Patient presents emergency department after ingesting liquid nicotine .  Girlfriend is concerned about a suicide attempt but patient denies this.  On exam not in acute distress.  He is mentating well.  Did fill out IVC paperwork because I am also concerned that this is a suicide attempt.  Discussed with poison control recommended observation for 6 hours and if he was asymptomatic at the 6-hour mark and was not having severe symptoms he was medically cleared.  Did develop some nausea and vomiting and was given nausea medication shortly after arrival to the emergency department and upon re-evaluation was feeling much better and was tolerating p.o.  Blood work and EKG unremarkable.  Patient now medically cleared for psychiatric evaluation.  Does have a history of alcohol withdrawals and was  placed on CIWA protocol as well  This patient presents to the ED for concern of complaints listed in HPI, this involves an extensive number of treatment options, and is a complaint that carries with it a high risk of complications and morbidity. Disposition including potential need for admission considered.   Dispo: TTS boarder  Additional history obtained from significant other Records reviewed Outpatient Clinic Notes and ED Visit Notes The following labs were independently interpreted: Chemistry and show no acute abnormality I personally reviewed and interpreted cardiac monitoring: normal sinus rhythm  I personally reviewed and interpreted the pt's EKG: see above for interpretation  I have reviewed the patients home medications and made adjustments as needed Consults: TTS  Portions of this note were generated with Dragon dictation software. Dictation errors may occur despite best attempts at proofreading.     Final diagnoses:  Suicide attempt [facility])    ED Discharge Orders     None          Yolande Lamar BROCKS, MD 11/10/23 [phone]

## 2023-11-09 NOTE — ED Notes (Signed)
IVC PAPERWORK NOW ATTACHED TO THE CLIP BOARD IN PURPLE ZONE

## 2023-11-09 NOTE — ED Notes (Signed)
Pt changed into blue scrubs

## 2023-11-09 NOTE — ED Triage Notes (Signed)
 According to [place] ems: Pt is coming from home. Pt drank approximately 60 ml of liquid nicotine  about an hour ago (1555).Girlfriend said he has had multiple Si attempts, pt says his vapes were broken so he drank the nicotine  to get his fix. Poison called control called by ems, they said to watch out for arrhythmias, hypotension, and tachycardia. Pt is pale, diaphoretic and feels dizzy. Pt says he has been feeling emotional since running out of his clonazepam.   Pt ao x4, and ambulatory. Pt has cuts and scars on arms from previous si attempt.  Vitals: 160/90 80 sinus rhythm 96% room air 116 cbg  12 lead EKG unremarkable.

## 2023-11-09 NOTE — BH Assessment (Signed)
 Clinician spoke to with IRIS to complete pt's TTS assessment. Clinician provided pt's name, MRN, location, age, room number and provider's name Secure message completed.    Iris coordinator to update secure chat when assessment time and provider are assigned.   Redmond Pulling, MS, [facility], [facility] Triage Specialist [phone]

## 2023-11-09 NOTE — ED Notes (Signed)
 Safety rounding completed. Pt resting in bed.

## 2023-11-09 NOTE — ED Notes (Signed)
 This RN receives care of this PT at this time.

## 2023-11-09 NOTE — ED Notes (Signed)
 Patient placed on cardiac monitor.

## 2023-11-09 NOTE — ED Notes (Signed)
 Patient belongings placed in belongings bag in locker #2 of psych unit. The patient has 1 shirt 1 pants 1 jacket and 1 pair of shoes. 1 pair of socks.

## 2023-11-09 NOTE — ED Notes (Signed)
 Safety rounding completed. Pt up to use the bathroom.

## 2023-11-09 NOTE — ED Notes (Signed)
 Safety rounding completed. PT asleep

## 2023-11-09 NOTE — ED Notes (Signed)
 Patient is ivc; case number [id number]; Envelope # [id number]; paperwork is in orange zone

## 2023-11-09 NOTE — ED Triage Notes (Signed)
  Order placed for suicide monitoring 1:1 sitter. Patient in the mean time is in a hallway bed next to the nurses station. Patient ratio is 4:1 with medical patients. No possible way for me to monitor full time until sitter arrives.

## 2023-11-09 NOTE — ED Notes (Signed)
 This RN called the staffing department for sitters and was told that there are no sitters available at this time.

## 2023-11-10 ENCOUNTER — Encounter (HOSPITAL_COMMUNITY): Payer: Self-pay | Admitting: Psychiatry

## 2023-11-10 ENCOUNTER — Other Ambulatory Visit: Payer: Self-pay

## 2023-11-10 ENCOUNTER — Inpatient Hospital Stay (HOSPITAL_COMMUNITY)
Admission: AD | Admit: 2023-11-10 | Discharge: 2023-11-15 | DRG: 885 | Disposition: A | Discharge status: Home / Self Care | Source: Intra-hospital

## 2023-11-10 DIAGNOSIS — Z91141 Patient's other noncompliance with medication regimen due to financial hardship: Secondary | ICD-10-CM

## 2023-11-10 DIAGNOSIS — F3130 Bipolar disorder, current episode depressed, mild or moderate severity, unspecified: Secondary | ICD-10-CM

## 2023-11-10 DIAGNOSIS — I10 Essential (primary) hypertension: Secondary | ICD-10-CM | POA: Diagnosis present

## 2023-11-10 DIAGNOSIS — Z9152 Personal history of nonsuicidal self-harm: Secondary | ICD-10-CM

## 2023-11-10 DIAGNOSIS — Z716 Tobacco abuse counseling: Secondary | ICD-10-CM | POA: Diagnosis not present

## 2023-11-10 DIAGNOSIS — F411 Generalized anxiety disorder: Secondary | ICD-10-CM | POA: Diagnosis present

## 2023-11-10 DIAGNOSIS — Z818 Family history of other mental and behavioral disorders: Secondary | ICD-10-CM

## 2023-11-10 DIAGNOSIS — Z560 Unemployment, unspecified: Secondary | ICD-10-CM

## 2023-11-10 DIAGNOSIS — Z79899 Other long term (current) drug therapy: Secondary | ICD-10-CM | POA: Diagnosis not present

## 2023-11-10 DIAGNOSIS — F410 Panic disorder [episodic paroxysmal anxiety] without agoraphobia: Secondary | ICD-10-CM | POA: Diagnosis present

## 2023-11-10 DIAGNOSIS — F17223 Nicotine dependence, chewing tobacco, with withdrawal: Secondary | ICD-10-CM | POA: Diagnosis not present

## 2023-11-10 DIAGNOSIS — G4700 Insomnia, unspecified: Secondary | ICD-10-CM | POA: Diagnosis present

## 2023-11-10 DIAGNOSIS — E559 Vitamin D deficiency, unspecified: Secondary | ICD-10-CM | POA: Diagnosis present

## 2023-11-10 DIAGNOSIS — Z809 Family history of malignant neoplasm, unspecified: Secondary | ICD-10-CM

## 2023-11-10 DIAGNOSIS — F319 Bipolar disorder, unspecified: Secondary | ICD-10-CM

## 2023-11-10 DIAGNOSIS — T6592XA Toxic effect of unspecified substance, intentional self-harm, initial encounter: Secondary | ICD-10-CM

## 2023-11-10 DIAGNOSIS — Z59869 Financial insecurity, unspecified: Secondary | ICD-10-CM

## 2023-11-10 DIAGNOSIS — Z833 Family history of diabetes mellitus: Secondary | ICD-10-CM

## 2023-11-10 DIAGNOSIS — F314 Bipolar disorder, current episode depressed, severe, without psychotic features: Principal | ICD-10-CM | POA: Diagnosis present

## 2023-11-10 DIAGNOSIS — Z8249 Family history of ischemic heart disease and other diseases of the circulatory system: Secondary | ICD-10-CM | POA: Diagnosis not present

## 2023-11-10 DIAGNOSIS — T6591XA Toxic effect of unspecified substance, accidental (unintentional), initial encounter: Secondary | ICD-10-CM

## 2023-11-10 DIAGNOSIS — F13239 Sedative, hypnotic or anxiolytic dependence with withdrawal, unspecified: Secondary | ICD-10-CM | POA: Diagnosis not present

## 2023-11-10 DIAGNOSIS — F17200 Nicotine dependence, unspecified, uncomplicated: Secondary | ICD-10-CM | POA: Diagnosis present

## 2023-11-10 MED ORDER — NICOTINE POLACRILEX 2 MG MT GUM
2.0000 mg | CHEWING_GUM | OROMUCOSAL | Status: DC | PRN
Start: 2023-11-10 — End: 2023-11-11
  Administered 2023-11-10 (×2): 2 mg via ORAL
  Filled 2023-11-10: qty 1

## 2023-11-10 MED ORDER — ONDANSETRON 4 MG PO TBDP: 4.0000 mg | ORAL_TABLET | Freq: Four times a day (QID) | ORAL | Status: AC | PRN

## 2023-11-10 MED ORDER — OLANZAPINE 5 MG PO TBDP
10.0000 mg | ORAL_TABLET | Freq: Every day | ORAL | Status: DC
  Administered 2023-11-10: 10 mg via ORAL
  Filled 2023-11-10: qty 2

## 2023-11-10 MED ORDER — NICOTINE 14 MG/24HR TD PT24
14.0000 mg | MEDICATED_PATCH | Freq: Once | TRANSDERMAL | Status: DC
  Administered 2023-11-10: 14 mg via TRANSDERMAL
  Filled 2023-11-10: qty 1

## 2023-11-10 MED ORDER — OLANZAPINE 10 MG IM SOLR: 5.0000 mg | Freq: Three times a day (TID) | INTRAMUSCULAR | Status: DC | PRN

## 2023-11-10 MED ORDER — QUETIAPINE FUMARATE 25 MG PO TABS
25.0000 mg | ORAL_TABLET | Freq: Two times a day (BID) | ORAL | Status: DC
Start: 2023-11-10 — End: 2023-11-10
  Administered 2023-11-10: 25 mg via ORAL
  Filled 2023-11-10: qty 1

## 2023-11-10 MED ORDER — ADULT MULTIVITAMIN W/MINERALS CH
1.0000 | ORAL_TABLET | Freq: Every day | ORAL | Status: DC
  Administered 2023-11-10 – 2023-11-13 (×4): 1 via ORAL
  Filled 2023-11-10 (×6): qty 1

## 2023-11-10 MED ORDER — VITAMIN B-1 100 MG PO TABS
100.0000 mg | ORAL_TABLET | Freq: Every day | ORAL | Status: DC
  Administered 2023-11-11 – 2023-11-14 (×4): 100 mg via ORAL
  Filled 2023-11-10 (×5): qty 1

## 2023-11-10 MED ORDER — IBUPROFEN 200 MG PO TABS
200.0000 mg | ORAL_TABLET | Freq: Four times a day (QID) | ORAL | Status: DC | PRN
  Administered 2023-11-10: 200 mg via ORAL
  Filled 2023-11-10: qty 1

## 2023-11-10 MED ORDER — DIVALPROEX SODIUM 500 MG PO DR TAB: 500.0000 mg | DELAYED_RELEASE_TABLET | Freq: Two times a day (BID) | ORAL | Status: DC

## 2023-11-10 MED ORDER — ZIPRASIDONE MESYLATE 20 MG IM SOLR: 10.0000 mg | Freq: Four times a day (QID) | INTRAMUSCULAR | Status: DC | PRN

## 2023-11-10 MED ORDER — LOPERAMIDE HCL 2 MG PO CAPS: 2.0000 mg | ORAL_CAPSULE | ORAL | Status: AC | PRN

## 2023-11-10 MED ORDER — IBUPROFEN 400 MG PO TABS
600.0000 mg | ORAL_TABLET | Freq: Once | ORAL | Status: AC
  Administered 2023-11-10: 600 mg via ORAL
  Filled 2023-11-10: qty 1

## 2023-11-10 MED ORDER — QUETIAPINE FUMARATE 25 MG PO TABS
25.0000 mg | ORAL_TABLET | Freq: Two times a day (BID) | ORAL | Status: DC
Start: 2023-11-10 — End: 2023-11-10

## 2023-11-10 MED ORDER — LORAZEPAM 1 MG PO TABS
1.0000 mg | ORAL_TABLET | Freq: Four times a day (QID) | ORAL | Status: DC | PRN
  Administered 2023-11-10 – 2023-11-11 (×3): 1 mg via ORAL
  Filled 2023-11-10 (×3): qty 1

## 2023-11-10 MED ORDER — OLANZAPINE 5 MG PO TBDP: 10.0000 mg | ORAL_TABLET | Freq: Every day | ORAL | Status: DC

## 2023-11-10 MED ORDER — OLANZAPINE 5 MG PO TBDP: 5.0000 mg | ORAL_TABLET | Freq: Three times a day (TID) | ORAL | Status: DC | PRN

## 2023-11-10 MED ORDER — TRAZODONE HCL 50 MG PO TABS: 50.0000 mg | ORAL_TABLET | Freq: Every evening | ORAL | Status: DC | PRN

## 2023-11-10 MED ORDER — DIVALPROEX SODIUM 500 MG PO DR TAB
500.0000 mg | DELAYED_RELEASE_TABLET | Freq: Two times a day (BID) | ORAL | Status: DC
  Administered 2023-11-10 (×2): 500 mg via ORAL
  Filled 2023-11-10 (×2): qty 1

## 2023-11-10 MED ORDER — HYDROXYZINE HCL 25 MG PO TABS
25.0000 mg | ORAL_TABLET | Freq: Four times a day (QID) | ORAL | Status: DC | PRN
  Administered 2023-11-10: 25 mg via ORAL
  Filled 2023-11-10 (×2): qty 1

## 2023-11-10 MED ORDER — TRAZODONE HCL 50 MG PO TABS
50.0000 mg | ORAL_TABLET | Freq: Every evening | ORAL | Status: DC | PRN
Start: 2023-11-10 — End: 2023-11-12
  Administered 2023-11-10 – 2023-11-11 (×2): 50 mg via ORAL
  Filled 2023-11-10 (×2): qty 1

## 2023-11-10 NOTE — ED Notes (Signed)
 PT is up and waiting for TTS consult. Monitor is bedside.No sitter available due to staffing issues

## 2023-11-10 NOTE — Progress Notes (Signed)
 Pt has been accepted to [facility] on 11/10/2023 Bed assignment: 305-01  Pt meets inpatient criteria per: [place]  Georgina NP  Attending Physician will be: Dr. Prentis    Report can be called to unit: Adult unit: [phone]  Pt can arrive after 1 PM   Care Team Notified: [facility] McNichol RN, Fonda Masters RN, Starlyn Patron NP  [place] Joyann Spidle LCSW-A   11/10/2023 10:09 AM

## 2023-11-10 NOTE — ED Provider Notes (Addendum)
 Emergency Medicine Observation Re-evaluation Note  [name] is a 58 y.o. male, seen on rounds today.  Pt initially presented to the ED for complaints of Suicidal Currently, the patient is resting comfortably.   Physical Exam  BP (!) 143/76   Pulse 72   Temp 98 F (36.7 C)   Resp 18   Ht 5' 9 (1.753 m)   Wt 81 kg   SpO2 100%   BMI 26.37 kg/m  Physical Exam General: NAD Lungs: No respiratory distress Psych: Calm, cooperative  ED Course / MDM  EKG:EKG Interpretation Date/Time:  Tuesday November 09 2023 16:12:26 EDT Ventricular Rate:  85 PR Interval:  154 QRS Duration:  84 QT Interval:  380 QTC Calculation: 452 R Axis:   -7  Text Interpretation: Normal sinus rhythm Possible Left atrial enlargement Minimal voltage criteria for LVH, may be normal variant ( R in aVL ) Borderline ECG When compared with ECG of 09-May-2022 01:36, PREVIOUS ECG IS PRESENT Confirmed by Yolande Charleston [phone]) on 11/09/2023 4:21:31 PM  I have reviewed the labs performed to date as well as medications administered while in observation.  Recent changes in the last 24 hours include TTS evaluated and recommended inpatient BH.  Plan  Current plan is for inpatient [facility], DO 11/10/23 9178    Gennaro Duwaine LITTIE, DO 11/10/23 [phone]

## 2023-11-10 NOTE — Tx Team (Signed)
 Initial Treatment Plan 11/10/2023 6:09 PM [name] [id number]    PATIENT STRESSORS: Medication change or noncompliance     PATIENT STRENGTHS: Capable of independent living  Motivation for treatment/growth    PATIENT IDENTIFIED PROBLEMS: Anxiety  Med non-Compliance  Depression  Impulsiveness               DISCHARGE CRITERIA:  Improved stabilization in mood, thinking, and/or behavior Verbal commitment to aftercare and medication compliance  PRELIMINARY DISCHARGE PLAN: Outpatient therapy  PATIENT/FAMILY INVOLVEMENT: This treatment plan has been presented to and reviewed with the patient, [name], and/or family member.  The patient and family have been given the opportunity to ask questions and make suggestions.  Matthew JINNY Cassette, RN 11/10/2023, 6:09 PM

## 2023-11-10 NOTE — ED Notes (Signed)
 Pt made aware that he would be leaving for a facility soon

## 2023-11-10 NOTE — Group Note (Signed)
 Date:  11/10/2023 Time:  4:21 PM  Group Topic/Focus:  Dimensions of Wellness:   The focus of this group is to introduce the topic of wellness and discuss the role each dimension of wellness plays in total health.    Participation Level:  Active  Participation Quality:  Appropriate  Affect:  Appropriate  Cognitive:  Appropriate  Insight: Appropriate  Engagement in Group:  Engaged  Modes of Intervention:  Discussion   Annalee  Kiela Shisler 11/10/2023, 4:21 PM

## 2023-11-10 NOTE — Plan of Care (Signed)
   Problem: Education: Goal: Knowledge of [place] [facility] Education information/materials will improve Outcome: Progressing Goal: Emotional status will improve Outcome: Progressing Goal: Mental status will improve Outcome: Progressing

## 2023-11-10 NOTE — B[facility] Group Notes (Signed)
 Adult Psychoeducational Group Note  Date:  11/10/2023 Time:  9:23 PM  Group Topic/Focus:  Wrap-Up Group:   The focus of this group is to help patients review their daily goal of treatment and discuss progress on daily workbooks.  Participation Level:  Active  Participation Quality:  Appropriate  Affect:  Appropriate  Cognitive:  Appropriate  Insight: Appropriate  Engagement in Group:  Engaged  Modes of Intervention:  Discussion  Additional Comments:  Anthany daid hsi day was a 6. Enjoy the day was his favorite part of the day. Coping skills talking and resting favorite part of the day meeting new friends and eating something you like about yourself open to others. Continue recobery stay on medicine  Lang Donia Law 11/10/2023, 9:23 PM

## 2023-11-10 NOTE — Progress Notes (Signed)
 Admission Note:   Pt is a 58 year old male admitted to [facility]. PT has a hx of depression, bipolar disorder, and HTN. Pt stopped taking his meds 2 weeks ago. Pt has been experiencing increasing depression and anxiety. Pt's stated that his vape broke, so he opened it up and drank 60 ml of liquid nicotine  from his vape. Pt's gf took him to the hospital were poison control was called. Pt currently denies SI, HI, and AVH. Pt denies alcohol or substance abuse. Admission is complete and all consents are signed. Pt,s contents are secured in locker. Skin assessment complete. Skin is unremarkable and no contraband found. Pt was oriented to the unit. Pt is encouraged to come to staff with any needs or concerns. Nutrition is offered. Q 15 min checks for safety have begun. Pt remains safe on the unit. Will continue to monitor.

## 2023-11-10 NOTE — ED Notes (Addendum)
 PT is resting comfortably at this time, no sitter available for this PT at this time per staffing

## 2023-11-10 NOTE — Consult Note (Signed)
 [facility] Consult Note  Patient Name: [name] MRN: [id number] DOB: [birth year] DATE OF Consult: 11/10/2023  PRIMARY PSYCHIATRIC DIAGNOSES  1.  Bipolar I D/O mre depressed 2.  GAD, reports hx of panic symptoms 3.  Intentional ingestion overdose of nicotine  unknown intent-reported at suicide by GF  RECOMMENDATIONS  Inpt psych admission recommended:    [x] YES       []  NO   If yes:       [x]   Pt meets involuntary commitment criteria if not voluntary  Patient is on a commitment or court hold order that authorizes confinement.   Patient is a 58 year old individual with a known diagnosis of bipolar disorder who has been off prescribed psychiatric medications. The patient has a history of suicidal gestures. Yesterday evening, the patient reportedly ingested a large amount of liquid nicotine , which can be toxic and potentially lethal. The patient's girlfriend reports concern that this act may have been a suicidal gesture, consistent with prior behavior.  During evaluation, the patient stated that this vape pen was broken and that he was "hoping to get a high," denying intent to self-harm. However, given the amount of nicotine  reportedly ingested, the known psychiatric history, the recent medication noncompliance, and the concern from a credible collateral source (girlfriend) that this behavior was self-injurious, the patient poses a significant risk of harm to self at this time.  Therefore, continued involuntary commitment (IVC) is indicated for further psychiatric evaluation, stabilization, and safety monitoring. Require re-initiation of medication and the monitoring of effects which are unable to be managed in an outpatient setting at this time    Medication recommendations:  ED provider has already initiated CIWA protocol with lorazepam--will hold re-order of clonazepam, concerns of potential misuse as initially reported ran out early, when inquired by this provider he stated he has  some left but no refills, UDS was negative  Re-initiation of depakote   DR 500mg  po twice daily for mood  Re-initiation of olanzapine  10mg  po bedtime for mood/sleep  Initiation of quetiapine  25mg  po in AM and 25mg  po in afternoon for anxiety/mood   Benzodiazepines are not known to be more effective than SSRI for panic disorder; concerns for long term use with benzodiazepines and physical dependency; however, SSRIs are potentially contraindicated given pt bipolar hx;  will trial low dose quetiapine  as anxiolytic in AM and afternoon while maintinaing the olanzapine  at bedtime for mood/sleep;  if metabolics become a concern, once mood is stable again with depakote , may recommend trial of buspirone or low dose venlafaxine and monitor for potential mania  PRNs Trazodone   50mg  po at bedtime as needed for sleep-may give dose tonight Ziprasidone 10mg  po/IM every 6 hours as needed for agitation/aggressive behaviors. Do Not exceed 40mg  in 24hrs.   Please ensure K> 4, Mg> 2 and Qtc < 500 when using antipsychotics. Monitor for extrapyramidal syndrome (EPS) such as dystonia, akathisia, and tardive dyskinesia  Non-Medication recommendations:  recommend CBT for coping skills and tools for panic symptoms   Communication: Treatment team members (and family members if applicable) who were involved in treatment/care discussions and planning, and with whom we spoke or engaged with via secure text/chat, include the following: epic chat Nurses [name], and [name] [facility]    I have discussed my assessment and treatment recommendations with the patient. Possible medication side effects/risks/benefits of current regimen.   Importance of medication adherence for medication to be beneficial.   Follow-Up Telepsychiatry C/L services:            []   We will continue to follow this patient with you.             [x]  Will sign off for now. Please re-consult our service as necessary.  Thank you for involving us  in the  care of this patient. If you have any additional questions or concerns, please call [phone] and ask for me or the provider on-call.  TELEPSYCHIATRY ATTESTATION & CONSENT  As the provider for this telehealth consult, I attest that I verified the patient's identity using two separate identifiers, introduced myself to the patient, provided my credentials, disclosed my location, and performed this encounter via a HIPAA-compliant, real-time, face-to-face, two-way, interactive audio and video platform and with the full consent and agreement of the patient (or guardian as applicable.)  Patient physical location: [facility] ED. Telehealth provider physical location: home office in state of [place]  Video start time: 01:34 am  (Central Time) Video end time: 01:48 am  (Central Time)  IDENTIFYING DATA  [name] is a 58 y.o. year-old male for whom a psychiatric consultation has been ordered by the primary provider. The patient was identified using two separate identifiers.  CHIEF COMPLAINT/REASON FOR CONSULT  Well I drank too much liquid nicotine , my vape was broke and I thought would give me a high but only made me sick, and I'm bipolar and been out of my medication and out of work   HISTORY OF PRESENT ILLNESS (HPI)  The patient presented to ED  via EMS after  intentional ingestion overdose 60 mL of liquid nicotine  yesterday at 1555pm. Girlfriend believed was a suicide gestures, reports hx of multiple gestures, pt reported was attempt to get high as ran out of clonazepam   Hx of treatment for   Currently prescribed: depakote , olanzapine , clonazepam, reports unable to afford them due to being out of work   He initially reported out of meds, per PDMP he should still have supply from last refill on 10/17/23; his UDS was negative for benzo; he is stating he has to have my clonazepam for my panic attacks in the morning     Today, client  stated flat plain, not really happy, not really sad,  just borderline flat.   ymptoms of depression with anergia, anhedonia, amotivation, increasedanxiety, frequent worry,  reported panic symptoms get them in the mornings denied specific triggers, no reported obsessive/compulsive behaviors.  There is no evidence of psychosis or delusional thinking.  Client denied recent episodes of hypomania, hyperactivity, erratic/excessive spending, involvement in dangerous activities, self-inflated ego, grandiosity, or promiscuity.  sleeping 3-4 hrs/24hrs, appetite not good  concentration it's fine denied racy thoughts Reviewed active medication list/reviewed labs. Obtained Collateral information from medical record.  EKG QTC  452 Reviewed PDMP       PAST PSYCHIATRIC HISTORY    Previous Psychiatric Hospitalizations: hx of once-reported  at [facility] one year ago Previous Detox/Residential treatments: reports once 20+ yrs ago job made me go Outpt treatment:  [facility]  Previous psychotropic medication trials: aripiprazole, risperidone, eszopiclone, clonazepam, ropinirole, doxepine, vraylar, hydroxyzine , duloxetine , gabapentin depakote  prazosin olanzapine  amitriptyline mirtazapine quetiapine   Previous mental health diagnosis per client/MEDICAL RECORD NUMBERGAD  major depressive disorder, Bipolar disorder, current episode manic, severe , and alcohol use   Suicide attempts/self-injurious behaviors:  cuts and scars on arms from previous hx of suicide attempts   History of trauma/abuse/neglect/exploitation:  denied   PAST MEDICAL HISTORY  Past Medical History:  Diagnosis Date   Anxiety  Chronic pain    Depression    History of alcohol abuse    prior to 2010   Hypertension    Insomnia       HOME MEDICATIONS  Facility Ordered Medications  Medication   [COMPLETED] LORazepam (ATIVAN) tablet 2 mg   [COMPLETED] metoCLOPramide (REGLAN) injection 10 mg   [COMPLETED] diphenhydrAMINE (BENADRYL) injection 25 mg    LORazepam (ATIVAN) tablet 1-4 mg   Or   LORazepam (ATIVAN) injection 1-4 mg   thiamine (VITAMIN B1) tablet 100 mg   Or   thiamine (VITAMIN B1) injection 100 mg   folic acid (FOLVITE) tablet 1 mg   multivitamin with minerals tablet 1 tablet   PTA Medications  Medication Sig   DULoxetine  (CYMBALTA ) 30 MG capsule TAKE ONE CAPSULE BY MOUTH EVERY DAY (Patient taking differently: 60 mg.)   QUEtiapine  (SEROQUEL ) 50 MG tablet Take 1 tablet (50 mg total) by mouth at bedtime.   losartan -hydrochlorothiazide (HYZAAR) 100-12.5 MG tablet Take 1 tablet by mouth daily.   montelukast  (SINGULAIR ) 10 MG tablet Take 1 tablet (10 mg total) by mouth at bedtime.   cetirizine (ZYRTEC) 10 MG tablet Take 10 mg by mouth daily.      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        Have you used/abused any of the following (include frequency/amt/last use):  a. Tobacco products Y  amount: vapes b. ETOH  Y  last drink  2 weeks ago c. Cannabis  N  d. Cocaine  Y last use 20 + yrs  e. Prescription Stimulants N  f. Methamphetamine N  g. Inhalants N h. Sedative/sleeping pills ? Misuse   i. Hallucinogens N   j. Street Opioids N Y  k. Prescription opioids N   l. Other: specify (spice, K2, bath salts, etc.)  N    Any history of substance related:  Blackouts:  +  Tremors: +    D/T's: -  seizures: -    UDS negative  BAL<15        FAMILY HISTORY  Family History  Problem Relation Age of Onset   Cancer Mother        breast and bone   Other Father        died  age 51yo when [name] was 58yo   Diabetes Sister    Bipolar disorder Sister    Heart disease Neg Hx    Stroke Neg Hx    Hypertension Neg Hx    Family Psychiatric History (if known):  sister deceased but had bipolar; denied suicides, denied SUD   MENTAL STATUS EXAM (MSE)  Mental Status Exam: General Appearance: Casual  Orientation:  Full (Time, Place, and Person)  Memory:  Immediate;   Good Recent;   Fair Remote;   Fair  Concentration:  Concentration: Fair  Recall:  Good  Attention  Fair  Eye Contact:  Good  Speech:  Pressured  Language:  Good  Volume:  Normal  Mood: presents as anxious reported feeling flat  Affect:  Blunt  Thought Process:  Descriptions of Associations: Circumstantial  Thought Content:  Obsessions and Rumination  Suicidal Thoughts:  ? Suicide gesture per GF, pt denied  Homicidal Thoughts:  No  Judgement:  Impaired  Insight:  Lacking  Psychomotor Activity:  Restlessness  Akathisia:  Negative  Fund of Knowledge:  Good    Assets:  Communication Skills Desire for Improvement Housing Social Support  Cognition:  WNL  ADL's:  Intact  AIMS (if indicated):       VITALS  Blood pressure (!) 147/86, pulse 75, temperature 98.4 F (36.9 C), temperature source Oral, resp. rate 18, SpO2 100%.  LABS  Admission on 11/09/2023  Component Date Value Ref Range Status   Sodium 11/09/2023 136  135 - 145 mmol/L Final   Potassium 11/09/2023 3.7  3.5 - 5.1 mmol/L Final   Chloride 11/09/2023 101  98 - 111 mmol/L Final   CO2 11/09/2023 25  22 - 32 mmol/L Final   Glucose, Bld 11/09/2023 109 (H)  70 - 99 mg/dL Final   Glucose reference range applies only to samples taken after fasting for at least 8 hours.   BUN 11/09/2023 10  6 - 20 mg/dL Final   Creatinine, Ser 11/09/2023 0.96  0.61 - 1.24 mg/dL Final   Calcium 89/71/7974 8.6 (L)  8.9 - 10.3 mg/dL Final   Total Protein 89/71/7974 5.9 (L)  6.5 - 8.1 g/dL Final   Albumin 89/71/7974 2.9 (L)  3.5 - 5.0 g/dL Final   AST  89/71/7974 21  15 - 41 U/L Final   ALT 11/09/2023 43  0 - 44 U/L Final   Alkaline Phosphatase 11/09/2023 74  38 - 126 U/L Final   Total Bilirubin 11/09/2023 0.2  0.0 - 1.2 mg/dL Final   GFR, Estimated 11/09/2023 >60  >60 mL/min Final   Comment: (NOTE) Calculated using the CKD-EPI Creatinine Equation (2021)    Anion gap 11/09/2023 10  5 - 15 Final   Performed at [facility], [address] [place], [place] [address]   Alcohol, Ethyl (B) 11/09/2023 <15  <15 mg/dL Final   Comment: (NOTE) For medical purposes only. Performed at [facility], [address] [place], [place] [address]    Opiates 11/09/2023 NONE DETECTED  NONE DETECTED Final   Cocaine 11/09/2023 NONE DETECTED  NONE DETECTED Final   Benzodiazepines 11/09/2023 NONE DETECTED  NONE DETECTED Final   Amphetamines 11/09/2023 NONE DETECTED  NONE DETECTED Final   Tetrahydrocannabinol 11/09/2023 NONE DETECTED  NONE DETECTED Final   Barbiturates 11/09/2023 NONE DETECTED  NONE DETECTED Final   Comment: (NOTE) DRUG SCREEN FOR MEDICAL PURPOSES ONLY.  IF CONFIRMATION IS NEEDED FOR ANY PURPOSE, NOTIFY LAB WITHIN 5 DAYS.  LOWEST DETECTABLE LIMITS FOR URINE DRUG SCREEN Drug Class                     Cutoff (ng/mL) Amphetamine and metabolites    1000 Barbiturate and metabolites    200 Benzodiazepine                 200 Opiates and metabolites        300 Cocaine and metabolites        300 THC                            50 Performed at [facility], [address] [place], [place] [address]    WBC 11/09/2023  7.5  4.0 - 10.5 K/uL Final   RBC 11/09/2023 4.22  4.22 - 5.81 MIL/uL Final   Hemoglobin 11/09/2023 12.8 (L)  13.0 - 17.0 g/dL Final   HCT 89/71/7974 37.8 (L)  39.0 - 52.0 % Final   MCV 11/09/2023 89.6  80.0 - 100.0 fL Final   MCH 11/09/2023 30.3  26.0 - 34.0 pg Final   MCHC 11/09/2023 33.9  30.0 - 36.0 g/dL Final   RDW 89/71/7974 13.2  11.5 - 15.5 % Final   Platelets 11/09/2023 328  150 - 400 K/uL Final    nRBC 11/09/2023 0.0  0.0 - 0.2 % Final   Neutrophils Relative % 11/09/2023 73  % Final   Neutro Abs 11/09/2023 5.6  1.7 - 7.7 K/uL Final   Lymphocytes Relative 11/09/2023 15  % Final   Lymphs Abs 11/09/2023 1.1  0.7 - 4.0 K/uL Final   Monocytes Relative 11/09/2023 8  % Final   Monocytes Absolute 11/09/2023 0.6  0.1 - 1.0 K/uL Final   Eosinophils Relative 11/09/2023 2  % Final   Eosinophils Absolute 11/09/2023 0.1  0.0 - 0.5 K/uL Final   Basophils Relative 11/09/2023 1  % Final   Basophils Absolute 11/09/2023 0.1  0.0 - 0.1 K/uL Final   Immature Granulocytes 11/09/2023 1  % Final   Abs Immature Granulocytes 11/09/2023 0.07  0.00 - 0.07 K/uL Final   Performed at [facility], [address] [place], [place] [address]   Sodium 11/09/2023 134 (L)  135 - 145 mmol/L Final   Potassium 11/09/2023 3.7  3.5 - 5.1 mmol/L Final   Chloride 11/09/2023 99  98 - 111 mmol/L Final   BUN 11/09/2023 10  6 - 20 mg/dL Final   Creatinine, Ser 11/09/2023 1.00  0.61 - 1.24 mg/dL Final   Glucose, Bld 89/71/7974 108 (H)  70 - 99 mg/dL Final   Glucose reference range applies only to samples taken after fasting for at least 8 hours.   Calcium, Ion 11/09/2023 1.08 (L)  1.15 - 1.40 mmol/L Final   TCO2 11/09/2023 24  22 - 32 mmol/L Final   Hemoglobin 11/09/2023 11.9 (L)  13.0 - 17.0 g/dL Final   HCT 89/71/7974 35.0 (L)  39.0 - 52.0 % Final   Salicylate Lvl 11/09/2023 <7.0 (L)  7.0 - 30.0 mg/dL Final   Performed at [facility], [address] [place], [place] [address]   Acetaminophen  (Tylenol ), Serum 11/09/2023 <10 (L)  10 - 30 ug/mL Final   Comment: (NOTE) Therapeutic concentrations vary significantly. A range of 10-30 ug/mL  may be an effective concentration for many patients. However, some  are best treated at concentrations outside of this range. Acetaminophen  concentrations >150 ug/mL at 4 hours after ingestion  and >50 ug/mL at 12 hours after ingestion are often associated with  toxic  reactions.  Performed at [facility], [address] [place], [place] [address]    Color, Urine 11/09/2023 YELLOW  YELLOW Final   APPearance 11/09/2023 CLEAR  CLEAR Final   Specific Gravity, Urine 11/09/2023 1.008  1.005 - 1.030 Final   pH 11/09/2023 8.0  5.0 - 8.0 Final   Glucose, UA 11/09/2023 NEGATIVE  NEGATIVE mg/dL Final   Hgb urine dipstick 11/09/2023 NEGATIVE  NEGATIVE Final   Bilirubin Urine 11/09/2023 NEGATIVE  NEGATIVE Final   Ketones, ur 11/09/2023 NEGATIVE  NEGATIVE mg/dL Final   Protein, ur 89/71/7974 100 (A)  NEGATIVE mg/dL Final   Nitrite 89/71/7974 NEGATIVE  NEGATIVE  Final   Leukocytes,Ua 11/09/2023 NEGATIVE  NEGATIVE Final   RBC / HPF 11/09/2023 0-5  0 - 5 RBC/hpf Final   WBC, UA 11/09/2023 0-5  0 - 5 WBC/hpf Final   Bacteria, UA 11/09/2023 NONE SEEN  NONE SEEN Final   Squamous Epithelial / HPF 11/09/2023 0-5  0 - 5 /HPF Final   Performed at [facility], [address] [place], [place] [address]    PSYCHIATRIC REVIEW OF SYSTEMS (ROS)  Depression:      []  Denies all symptoms of depression [x] Depressed mood       [x] Insomnia/hypersomnia              [x] Fatigue        [x] Change in appetite     [x] Anhedonia                                [] Difficulty concentrating      [] Hopelessness             [] Worthlessness [] Guilt/shame                [] Psychomotor agitation/retardation   Mania:     [] Denies all symptoms of mania [] Elevated mood           [] Irritability         [x] Pressured speech         []  Grandiosity         [x]  Decreased need for sleep                                                 [] Increased energy          []  Increase in goal directed activity                                       [x] Flight of ideas    []  Excessive involvement in high-risk behaviors                   [x]  Distractibility     Psychosis:     [x] Denies all symptoms of psychosis [] Paranoia         []  Auditory Hallucinations          [] Visual hallucinations         [] ELOC         [] IOR                [] Delusions   Suicide:    [x]  Denies SI/plan/intent []  Passive SI         []   Active SI         [] Plan           [] Intent   Homicide:  [x]   Denies HI/plan/intent []  Passive HI         []  Active HI         [] Plan            [] Intent           [] Identified Target    Additional findings:      Musculoskeletal: No abnormal movements observed      Gait & Station: Laying/Sitting      Pain Screening: Present - mild  to moderate      Nutrition & Dental Concerns: none reported  RISK FORMULATION/ASSESSMENT  [place]-Suicide Severity Rating Scale (C-SSRS)  1) Have you wished you were dead or wished you could go to sleep and not wake up? no 2) Have you actually had any thoughts about killing yourself? no     Is the patient experiencing any suicidal or homicidal ideations:     ?  Yes pt with intentional ingestion of large amount of nicotine ; GF concerned was SI gesture, pt denying, has hx of high risk with previous gestures, reportedly not taking his medication recently for his bipolar illness         Protective factors considered for safety management:   Absence of psychosis Access to adequate health care Advice& help seeking Resourcefulness/Survival skills Children Sense of responsibility Positive social support: Positive therapeutic relationship    Risk factors/concerns considered for safety management:  [x] Prior attempt                                      [] Hopelessness  [] Family history of suicide                    [x] Impulsivity [x] Depression                                         [] Aggression [x] Substance abuse/dependence          [] Isolation [] Physical illness/chronic pain              [] Barriers to accessing treatment [x] Recent loss                                        [] Unwillingness to seek help [x] Access to lethal means                      [x] Male gender [] Age over 88                                        [] Unmarried   Is there a safety  management plan with the patient and treatment team to minimize risk factors and promote protective factors:     [x] YES          []  NO            Explain: safety obs, admit to inpt psychiatry unit    Is crisis care placement or psychiatric hospitalization recommended:  [x] YES    [] NO  Based on my current evaluation and risk assessment, patient is determined at this time to be ju:Yphy risk    *RISK ASSESSMENT Risk assessment is a dynamic process; it is possible that this patient's condition, and risk level, may change. This should be re-evaluated and managed over time as appropriate. Please re-consult psychiatric consult services if additional assistance is needed in terms of risk assessment and management. If your team decides to discharge this patient, please advise the patient how to best access emergency psychiatric services, or to call 911, if their condition worsens or they feel unsafe in any way.    Total time spent in this encounter was with  greater than 50% of time spent in counseling and coordination of care.     Dr. Kathren JUDITHANN Ada, PhD, MSN, APRN, PMHNP-BC, [facility] Driana Dazey  KANDICE Ada, NP Telepsychiatry Consult Services

## 2023-11-10 NOTE — ED Provider Notes (Signed)
 Provider Suicide Risk Assessment Note  Nursing Documentation C-SSRS RISK CATEGORY: No Risk  Is there a reason to believe patient is at risk or at higher risk than screening indicated?: Yes 1:1 monitoring intiated  Suicide Risk Assessment:   Based on my clinical evaluation, I estimate the patient to be at acute high risk of self-harm in the current setting. This decision is based on my review of the chart including patient's history and current presentation, interview of the patient, mental status examination, and consideration of suicide risk including evaluating suicidal ideation, plan, intent, suicidal or self-harm behaviors, risk factors, and protective factors.  Patient has following modifiable risk factors for suicide: recklessness and active mental illness (to encompass adhd, tbi, mania, psychosis, trauma reaction) Patient has following non-modifiable or demographic risk factors for suicide:male gender, history of self harm behavior, and psychiatric hospitalization Patient has the following protective factors against suicide: Supportive family Mitigation: Risk for suicide is being addressed by recommendations for: suicide precautions (moderate/high risk) and psychiatry consult as indicated (moderate/high risk)    Yolande Lamar BROCKS, MD 11/10/23 0025

## 2023-11-10 NOTE — Progress Notes (Signed)

## 2023-11-10 NOTE — Progress Notes (Signed)
   11/10/23 1800  Psych Admission Type (Psych Patients Only)  Admission Status Involuntary  Psychosocial Assessment  Patient Complaints Anxiety;Depression;Panic attack  Eye Contact Fair  Facial Expression Anxious  Affect Anxious  Speech Soft;Logical/coherent  Interaction Assertive  Motor Activity Slow  Appearance/Hygiene Unremarkable  Behavior Characteristics Cooperative  Mood Depressed;Anxious  Thought Process  Coherency WDL  Content WDL  Delusions None reported or observed  Perception WDL  Hallucination None reported or observed  Judgment WDL  Confusion None  Danger to Self  Current suicidal ideation? Denies  Danger to Others  Danger to Others None reported or observed

## 2023-11-10 NOTE — ED Notes (Signed)
 PT is bedside having his TTS consult

## 2023-11-10 NOTE — Plan of Care (Signed)
   Problem: Education: Goal: Mental status will improve Outcome: Progressing Goal: Verbalization of understanding the information provided will improve Outcome: Progressing   Problem: Activity: Goal: Interest or engagement in activities will improve Outcome: Progressing

## 2023-11-11 DIAGNOSIS — F17200 Nicotine dependence, unspecified, uncomplicated: Secondary | ICD-10-CM | POA: Diagnosis present

## 2023-11-11 DIAGNOSIS — F410 Panic disorder [episodic paroxysmal anxiety] without agoraphobia: Secondary | ICD-10-CM | POA: Diagnosis not present

## 2023-11-11 DIAGNOSIS — F411 Generalized anxiety disorder: Secondary | ICD-10-CM | POA: Diagnosis not present

## 2023-11-11 DIAGNOSIS — F314 Bipolar disorder, current episode depressed, severe, without psychotic features: Secondary | ICD-10-CM | POA: Diagnosis not present

## 2023-11-11 DIAGNOSIS — G4700 Insomnia, unspecified: Secondary | ICD-10-CM | POA: Diagnosis not present

## 2023-11-11 MED ORDER — WHITE PETROLATUM EX OINT
TOPICAL_OINTMENT | CUTANEOUS | Status: DC | PRN
  Administered 2023-11-12: 1 via TOPICAL
  Filled 2023-11-11: qty 5

## 2023-11-11 MED ORDER — NICOTINE 7 MG/24HR TD PT24: 7.0000 mg | MEDICATED_PATCH | Freq: Once | TRANSDERMAL | Status: DC

## 2023-11-11 MED ORDER — HYDROXYZINE HCL 50 MG PO TABS
50.0000 mg | ORAL_TABLET | Freq: Four times a day (QID) | ORAL | Status: AC | PRN
  Administered 2023-11-11 – 2023-11-12 (×4): 50 mg via ORAL
  Filled 2023-11-11 (×4): qty 1

## 2023-11-11 MED ORDER — NICOTINE 21 MG/24HR TD PT24
MEDICATED_PATCH | TRANSDERMAL | Status: AC
  Administered 2023-11-11: 21 mg
  Filled 2023-11-11: qty 1

## 2023-11-11 MED ORDER — CLONAZEPAM 0.5 MG PO TABS
0.5000 mg | ORAL_TABLET | Freq: Three times a day (TID) | ORAL | Status: DC | PRN
  Administered 2023-11-11 – 2023-11-15 (×14): 0.5 mg via ORAL
  Filled 2023-11-11 (×13): qty 1

## 2023-11-11 MED ORDER — IBUPROFEN 600 MG PO TABS: 600.0000 mg | ORAL_TABLET | Freq: Four times a day (QID) | ORAL | Status: DC | PRN

## 2023-11-11 MED ORDER — QUETIAPINE FUMARATE ER 50 MG PO TB24
150.0000 mg | ORAL_TABLET | Freq: Every day | ORAL | Status: DC
  Administered 2023-11-11: 150 mg via ORAL
  Filled 2023-11-11: qty 3

## 2023-11-11 MED ORDER — NICOTINE POLACRILEX 2 MG MT GUM
4.0000 mg | CHEWING_GUM | OROMUCOSAL | Status: DC | PRN
  Administered 2023-11-11 – 2023-11-15 (×16): 4 mg via ORAL
  Filled 2023-11-11 (×6): qty 2

## 2023-11-11 MED ORDER — IBUPROFEN 800 MG PO TABS
800.0000 mg | ORAL_TABLET | Freq: Three times a day (TID) | ORAL | Status: DC | PRN
  Administered 2023-11-11 – 2023-11-15 (×6): 800 mg via ORAL
  Filled 2023-11-11 (×6): qty 1

## 2023-11-11 MED ORDER — HYDROXYZINE HCL 50 MG PO TABS: 100.0000 mg | ORAL_TABLET | Freq: Every day | ORAL | Status: DC

## 2023-11-11 MED ORDER — NICOTINE 21 MG/24HR TD PT24
21.0000 mg | MEDICATED_PATCH | Freq: Every day | TRANSDERMAL | Status: DC
  Administered 2023-11-11 – 2023-11-15 (×5): 21 mg via TRANSDERMAL
  Filled 2023-11-11 (×5): qty 1

## 2023-11-11 MED ORDER — OLANZAPINE 5 MG PO TBDP
5.0000 mg | ORAL_TABLET | Freq: Three times a day (TID) | ORAL | Status: DC | PRN
  Administered 2023-11-11 – 2023-11-14 (×2): 5 mg via ORAL
  Filled 2023-11-11 (×2): qty 1

## 2023-11-11 MED ORDER — OLANZAPINE 10 MG IM SOLR
10.0000 mg | Freq: Three times a day (TID) | INTRAMUSCULAR | Status: DC | PRN
Start: 2023-11-11 — End: 2023-11-15

## 2023-11-11 MED ORDER — OLANZAPINE 10 MG IM SOLR
5.0000 mg | Freq: Three times a day (TID) | INTRAMUSCULAR | Status: DC | PRN
Start: 2023-11-11 — End: 2023-11-15

## 2023-11-11 NOTE — Discharge Instructions (Signed)
 Applying for [facility] -> 2nd Floor  [address] [place], [place] [address]  Ask to speak with: [name]   M-F 8-5pm [phone]   -------------------------------------------------  [name] [facility] OUTPATIENT Walk-in information:  Please note, all walk-ins are first come & first serve, with limited number of availability.  Therapist for therapy:  Monday & Wednesdays: Please ARRIVE at 7:00 AM for registration Will START at 8:00 AM Every 1st & 2nd Friday of the month: Please ARRIVE at 10:00 AM for registration Will START at 1 PM - 5 PM Psychiatrist for medication management: Monday - Friday:  Please ARRIVE at 7:00 AM for registration Will START at 8:00 AM    Regretfully, due to limited availability, please be aware that you may not been seen on the same day as walk-in. Please consider making an appoint or try again. Thank you for your patience and understanding.  [facility] [address]  [place], [place] [address] [phone]  New patients are seen at their walk-in clinic. Walk-in hours are Monday - Friday from 8:30 am - 12:00 pm, and from 1:00 pm - 2:30 pm.   Walk-in patients are seen on a first come, first served basis, so try to arrive as early as possible for the best chance of being seen the same day.  ---------------------------------------------------   -Follow-up with your outpatient psychiatric provider (and therapist) -instructions on appointment date, time, and address (location) are provided to you in discharge paperwork.  -Take your psychiatric medications as prescribed at discharge - instructions are provided to you in the discharge paperwork  -Follow-up with outpatient primary care doctor and other specialists -for management of preventative medicine and any chronic medical disease.  -Recommend abstinence from alcohol, tobacco, cannabis, and other substances at  discharge.   -If your psychiatric symptoms recur, worsen, or if you have severe side effects to your psychiatric medications, call your outpatient psychiatric provider, 911, 988 (national suicide hotline), go to [facility], or go to the nearest emergency department.  -If suicidal thoughts occur, call your outpatient psychiatric provider, 911, 988 (national suicide hotline), go to [facility], or go to the nearest emergency department.  Naloxone (Narcan) can help reverse an overdose when given to the victim quickly.  [facility] offers free naloxone kits and instructions/training on its use.  Add naloxone to your first aid kit and you can help save a life.   Pick up your free kit at the following locations:   [place]:  [facility], [address] [place] [address] [phone]) [facility] [address] [employer] [place] [id number] [phone]) [facility] [address] [place] [address]  [place]: [facility] [address] [place] [address] ([phone]) [facility] [address] [place] [address] [phone])

## 2023-11-11 NOTE — Progress Notes (Signed)
(  Sleep Hours) -5.0 as of 0530 (Any PRNs that were needed, meds refused, or side effects to meds)- prn trazodone ,hydroxyzine , and advil @ 2100 : ativan 1mg  @ 2358 (Any disturbances and when (visitation, over night)-none (Concerns raised by the patient)-none  (SI/HI/AVH)- denies all

## 2023-11-11 NOTE — Group Note (Signed)
 Date:  11/11/2023 Time:  9:12 PM  Group Topic/Focus:  Wrap-Up Group:   The focus of this group is to help patients review their daily goal of treatment and discuss progress on daily workbooks.    Participation Level:  Active  Participation Quality:  Appropriate  Affect:  Appropriate  Cognitive:  Appropriate  Insight: Appropriate  Engagement in Group:  Engaged  Modes of Intervention:  Discussion  Additional Comments:  Patient stated that his day was a 9 and his goal is to work on being more active in groups   [employer] 11/11/2023, 9:12 PM

## 2023-11-11 NOTE — Progress Notes (Signed)
   11/11/23 1400  Psych Admission Type (Psych Patients Only)  Admission Status Involuntary  Psychosocial Assessment  Patient Complaints Anxiety;Nervousness  Eye Contact Fair  Facial Expression Anxious  Affect Anxious  Speech Logical/coherent  Interaction Assertive  Motor Activity Slow  Appearance/Hygiene Unremarkable  Behavior Characteristics Cooperative  Mood Depressed;Anxious  Thought Process  Coherency WDL  Content WDL  Delusions None reported or observed  Perception WDL  Hallucination None reported or observed  Judgment WDL  Confusion None  Danger to Self  Current suicidal ideation? Denies  Danger to Others  Danger to Others None reported or observed
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
## 2023-11-11 NOTE — Group Note (Signed)
 Date:  11/11/2023 Time:  2:23 PM  Group Topic/Focus:  Emotional Education:   The focus of this group is to discuss what feelings/emotions are, and how they are experienced.    Participation Level:  Did Not Attend  Participation Quality:  Did Not Attend  Affect:  Did Not Attend  Cognitive:  Did Not Attend  Insight: None  Engagement in Group:  Did Not Attend  Modes of Intervention:  Did Not Attend  Additional Comments:  Did Not Attend  Lynnsey Barbara A Kehinde Bowdish 11/11/2023, 2:23 PM
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
## 2023-11-11 NOTE — Group Note (Signed)
 Date:  11/11/2023 Time:  9:19 AM  Group Topic/Focus:  Goals Group:   The focus of this group is to help patients establish daily goals to achieve during treatment and discuss how the patient can incorporate goal setting into their daily lives to aide in recovery.    Participation Level:  Active  Participation Quality:  Appropriate  Affect:  Appropriate  Cognitive:  Appropriate  Insight: Appropriate  Engagement in Group:  Engaged  Modes of Intervention:  Discussion  Additional Comments:  N/A  [name] A [name] 11/11/2023, 9:19 AM

## 2023-11-11 NOTE — Plan of Care (Signed)
  Problem: Activity: Goal: Interest or engagement in activities will improve Outcome: Progressing   Problem: Education: Goal: Emotional status will improve Outcome: Not Progressing Goal: Verbalization of understanding the information provided will improve Outcome: Not Progressing
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
## 2023-11-11 NOTE — Group Note (Signed)
 LCSW Group Therapy Note   Group Date: 11/11/2023 Start Time: 1100 End Time: 1200   Participation:  patient was present and actively participated in the discussion.   Type of Therapy:  Group Therapy  Topic:  Stress Less:  Nurturing Your Mind and Body Through Calm   Objective:  Learn techniques for managing stress through body relaxation, mindfulness, and self-compassion.  Goals: Use body relaxation techniques, such as Box Breathing and Progressive Muscle Relaxation, to reduce physical tension. Practice mindfulness to break the cycle of overthinking and mental chatter. Embrace self-compassion to handle stress with kindness and resilience.  Summary:  Today's session focused on calming the body with relaxation techniques, breaking the cycle of stress with mindfulness, and using self-compassion to manage challenges more gracefully. These tools help reduce stress and foster a balanced, peaceful mindset.  Therapeutic Modalities used:  Elements of CBT ( cognitive restructuring)  Elements of DBT (box breathing, progressive body relaxation, mindfulness, acceptance)    [name], LCSWA 11/11/2023  12:13 PM

## 2023-11-12 ENCOUNTER — Encounter (HOSPITAL_COMMUNITY): Payer: Self-pay | Admitting: Psychiatry

## 2023-11-12 ENCOUNTER — Encounter (HOSPITAL_COMMUNITY): Payer: Self-pay

## 2023-11-12 DIAGNOSIS — F314 Bipolar disorder, current episode depressed, severe, without psychotic features: Secondary | ICD-10-CM | POA: Diagnosis not present

## 2023-11-12 DIAGNOSIS — F411 Generalized anxiety disorder: Secondary | ICD-10-CM | POA: Diagnosis not present

## 2023-11-12 DIAGNOSIS — F410 Panic disorder [episodic paroxysmal anxiety] without agoraphobia: Secondary | ICD-10-CM | POA: Diagnosis not present

## 2023-11-12 DIAGNOSIS — G4700 Insomnia, unspecified: Secondary | ICD-10-CM | POA: Diagnosis not present

## 2023-11-12 LAB — VITAMIN D 25 HYDROXY (VIT D DEFICIENCY, FRACTURES): Vit D, 25-Hydroxy: 13.63 ng/mL — ABNORMAL LOW (ref 30–100)

## 2023-11-12 LAB — LIPID PANEL
Cholesterol: 199 mg/dL (ref 0–200)
HDL: 48 mg/dL (ref >40)
LDL Cholesterol: 125 mg/dL — ABNORMAL HIGH (ref 0–99)
Total CHOL/HDL Ratio: 4.1 ratio
Triglycerides: 129 mg/dL (ref <150)
VLDL: 26 mg/dL (ref 0–40)

## 2023-11-12 LAB — TSH: TSH: 0.939 u[IU]/mL (ref 0.350–4.500)

## 2023-11-12 LAB — HEMOGLOBIN A1C
Hgb A1c MFr Bld: 5 % (ref 4.8–5.6)
Mean Plasma Glucose: 96.8 mg/dL

## 2023-11-12 MED ORDER — ACETAMINOPHEN 500 MG PO TABS
1000.0000 mg | ORAL_TABLET | Freq: Four times a day (QID) | ORAL | Status: DC | PRN
  Administered 2023-11-12 – 2023-11-14 (×3): 1000 mg via ORAL
  Filled 2023-11-12 (×3): qty 2

## 2023-11-12 MED ORDER — GABAPENTIN 100 MG PO CAPS
100.0000 mg | ORAL_CAPSULE | Freq: Three times a day (TID) | ORAL | Status: DC
  Administered 2023-11-12 – 2023-11-13 (×3): 100 mg via ORAL
  Filled 2023-11-12 (×3): qty 1

## 2023-11-12 MED ORDER — ALUM & MAG HYDROXIDE-SIMETH 200-200-20 MG/5ML PO SUSP: 30.0000 mL | ORAL | Status: DC | PRN

## 2023-11-12 MED ORDER — VITAMIN D (ERGOCALCIFEROL) 1.25 MG (50000 UNIT) PO CAPS
50000.0000 [IU] | ORAL_CAPSULE | ORAL | Status: DC
  Administered 2023-11-13: 50000 [IU] via ORAL
  Filled 2023-11-12: qty 1

## 2023-11-12 MED ORDER — CLONIDINE HCL 0.1 MG PO TABS
0.1000 mg | ORAL_TABLET | Freq: Two times a day (BID) | ORAL | Status: DC | PRN
  Administered 2023-11-12 – 2023-11-13 (×3): 0.1 mg via ORAL
  Filled 2023-11-12 (×3): qty 1

## 2023-11-12 MED ORDER — QUETIAPINE FUMARATE ER 300 MG PO TB24
300.0000 mg | ORAL_TABLET | Freq: Every day | ORAL | Status: DC
Start: 2023-11-12 — End: 2023-11-15
  Administered 2023-11-12 – 2023-11-14 (×3): 300 mg via ORAL
  Filled 2023-11-12: qty 10
  Filled 2023-11-12 (×3): qty 1

## 2023-11-12 MED ORDER — AMLODIPINE BESYLATE 5 MG PO TABS
5.0000 mg | ORAL_TABLET | Freq: Every day | ORAL | Status: DC
  Administered 2023-11-12: 5 mg via ORAL
  Filled 2023-11-12 (×2): qty 1

## 2023-11-12 NOTE — Group Note (Signed)
 Date:  11/12/2023 Time:  6:52 PM  Group Topic/Focus: 5 love languages Identifying Needs:   The focus of this group is to help patients identify their personal needs that have been historically problematic and identify healthy behaviors to address their needs.    Participation Level:  Active  Participation Quality:  Appropriate  Affect:  Appropriate  Cognitive:  Appropriate  Insight: Appropriate  Engagement in Group:  Engaged  Modes of Intervention:  Discussion and Education  Additional Comments:    [name] 11/12/2023, 6:52 PM

## 2023-11-12 NOTE — Plan of Care (Signed)
   Problem: Education: Goal: Emotional status will improve Outcome: Progressing Goal: Mental status will improve Outcome: Progressing

## 2023-11-12 NOTE — Group Note (Deleted)
 Recreation Therapy Group Note   Group Topic:Problem Solving  Group Date: 11/12/2023 Start Time: 0934 End Time: 1000 Facilitators: Lourine Alberico-McCall, LRT,CTRS Location: [address] Dayroom   Group Topic: Halloween Trivia  Goal Area(s) Addresses:  Patient will effectively work in a team with other group members. Patient will verbalize importance of using appropriate problem solving techniques.   Behavioral Response: Observant  Intervention: Trivia  Activity: Set Designer. Patients worked together in teams. LRT read questions from 6 different categories (Classic Horror, Monster Movies, Slasher Flicks, Behind-the-Scenes, Caution! Plot and Beware the Grab Bag). Each group was to right down their answers. LRT would go over the answers with patients at the end of each category. The team with the most combined points (point total from each category) wins the game.    Education: Journalist, Newspaper, Team Building, Communication  Education Outcome: Acknowledges understanding/In group clarification offered/Needs additional education.    Affect/Mood: Appropriate   Participation Level: None   Participation Quality: None   Behavior: On-looking   Speech/Thought Process: None   Insight: None   Judgement: None   Modes of Intervention: Competitive Play   Patient Response to Interventions:  Attentive   Education Outcome:  In group clarification offered    Clinical Observations/Individualized Feedback: Pt didn't participate in the activity. Pt observed and listened to peers. Pt would laugh along with peers with some of the responses that were given in group.     Plan: Continue to engage patient in RT group sessions 2-3x/week.   Clarrisa Kaylor-McCall, LRT,CTRS 11/12/2023 11:41 AM

## 2023-11-12 NOTE — Progress Notes (Signed)
   11/12/23 2100  Psych Admission Type (Psych Patients Only)  Admission Status Involuntary  Psychosocial Assessment  Patient Complaints Anxiety;Nervousness  Eye Contact Fair  Facial Expression Anxious  Affect Anxious  Speech Logical/coherent  Interaction Assertive  Motor Activity Slow  Appearance/Hygiene Unremarkable  Behavior Characteristics Cooperative;Anxious  Mood Anxious  Thought Process  Coherency WDL  Content WDL  Delusions None reported or observed  Perception WDL  Hallucination None reported or observed  Judgment WDL  Confusion None  Danger to Self  Current suicidal ideation? Denies  Danger to Others  Danger to Others None reported or observed

## 2023-11-12 NOTE — Progress Notes (Signed)
(  Sleep Hours) -7.25  (Any PRNs that were needed, meds refused, or side effects to meds)- clonazepam 0.5mg , Trazodone  50mg , hydroxyzine  50mg   (Any disturbances and when (visitation, over night)-none  (Concerns raised by the patient)- none  (SI/HI/AVH)-denies

## 2023-11-12 NOTE — Progress Notes (Signed)
    Sleep Hours) - 6.75  (Any PRNs that were needed, meds refused, or side effects to meds)- Ibuprofen PRN, Hydroxyzine  PRN, Clonidine PRN, and Klonopin PRN  (Any disturbances and when (visitation, over night)-none  (Concerns raised by the patient)-  high anxiety and pain  (SI/HI/AVH)- denies

## 2023-11-12 NOTE — Group Note (Unsigned)
 Date:  11/12/2023 Time:  9:24 PM  Group Topic/Focus:  Relapse Prevention Planning:   The focus of this group is to define relapse and discuss the need for planning to combat relapse. Wrap-Up Group:   The focus of this group is to help patients review their daily goal of treatment and discuss progress on daily workbooks.     Participation Level:  {[facility] PARTICIPATION OZCZO:77735}  Participation Quality:  {[facility] PARTICIPATION QUALITY:22265}  Affect:  {[facility] AFFECT:22266}  Cognitive:  {[facility] COGNITIVE:22267}  Insight: {[facility] Insight2:20797}  Engagement in Group:  {[facility] ENGAGEMENT IN HMNLE:77731}  Modes of Intervention:  {[facility] MODES OF INTERVENTION:22269}  Additional Comments:  ***  [name] 11/12/2023, 9:24 PM

## 2023-11-12 NOTE — Plan of Care (Signed)
  Problem: Education: Goal: Knowledge of [facility] Education information/materials will improve Outcome: Progressing Goal: Verbalization of understanding the information provided will improve Outcome: Progressing   Problem: Education: Goal: Emotional status will improve Outcome: Not Progressing

## 2023-11-12 NOTE — Group Note (Signed)
 Date:  11/12/2023 Time:  10:24 AM  Group Topic/Focus:  Goals Group:   The focus of this group is to help patients establish daily goals to achieve during treatment and discuss how the patient can incorporate goal setting into their daily lives to aide in recovery.    Participation Level:  Active  Participation Quality:  Attentive  Affect:  Appropriate  Cognitive:  Appropriate  Insight: Appropriate  Engagement in Group:  Engaged  Modes of Intervention:  Education  Additional Comments:    [name] 11/12/2023, 10:24 AM

## 2023-11-12 NOTE — Group Note (Signed)
 Date:  11/12/2023 Time:  12:42 PM  Group Topic/Focus:  Wellness Toolbox:   The focus of this group is to discuss various aspects of wellness, balancing those aspects and exploring ways to increase the ability to experience wellness.  Patients will create a wellness toolbox for use upon discharge.    Participation Level:  Did Not Attend  Participation Quality:  Did Not Attend  Affect:  Did Not Attend  Cognitive:  Did Not Attend  Insight: None  Engagement in Group:  Did Not Attend  Modes of Intervention:  Did Not Attend  Additional Comments:  Did Not Attend  [name] 11/12/2023, 12:42 PM

## 2023-11-12 NOTE — Progress Notes (Signed)
 [facility] MD Progress Note  11/12/2023 12:25 PM [name]  MRN:  [id number]  Principal Problem: Bipolar disorder current episode depressed (HCC) Diagnosis: Principal Problem:   Bipolar 1 disorder, current episode depressed, severe (HCC) Active Problems:   Insomnia   GAD (generalized anxiety disorder)   Panic disorder   Nicotine  use disorder   Reason for Admission:  [name] is a 58 y.o. male  with a past psychiatric history of bipolar disorder, depression, anxiety. Patient initially arrived to [facility] on 11/09/2023 for reported suicide attempt, and admitted to [facility] under IVC on 11/10/2023 for acute safety concerns, crisis stabalization, acute suicidal or self-harming behaviors, impaired functioning, and stabilization of acute on chronic psychiatric conditions. PMHx is significant for HTN, insomnia, headaches.  (admitted on 11/10/2023, total  LOS: 2 days )   last 24 hours:  BP 163/115, otherwise VSS.  Several doses of nicotine  gum yesterday but none today.  Got all doses of clonazepam as needed and hydroxyzine  as needed.  Taking all scheduled meds.  Per nursing patient is very perseverative about getting as needed meds and is at the medication window every 30 minutes.   Information Obtained Today During Patient Interview:  Reports that he did not sleep well with the Seroquel  and is asking to have this increase.  Denies any side effects to it including morning time sedation.  Reports that he is continuing to feel very anxious throughout the day.  Asked about things that have been previously helpful for him and he says that really the benzodiazepines have only helped and that he is upset that he is dependent on them but they are the only thing that helps.  Does report that he took gabapentin the past and had some benefit.  He then was very perseverative about taking gabapentin and was asking what dose we would start and when.  Reports that he is eating well.  Says that he is not having thoughts to  harm self or others today.  Continues to insist that the overdose was impulsive and he was just trying to get high in the nicotine .     Past Psychiatric Hx: Current Psychiatrist: Rosslyn Prost, [facility] Current Therapist: none Previous Psychiatric Diagnoses: Bipolar I, GAD  Psychiatric Medications: Current Olanzapine  5 mg once day Hydroxyzine  50 mg TID PRN Divalproex  ER 500 mg three times daily Clonazepam 0.5 mg TID PRN Past Quetiapine  50 mg  Eszopiclone 2 mg Brexpiprazole 1 mg Aripiprazole 2 mg Psychiatric Hospitalization hx: hospitalized 1 year ago for manic episode Psychotherapy hx: no history of therapy ACT team hx: none Neuromodulation history: none  History of suicide: none  History of homicide or aggression: none   Substance Use Hx: Alcohol: None Nicotine : 24 mg daily via nicotine  vape, not interested in cutting back Cannabis: None Other Substances: denies Non-prescribed medications: none Rehab History: none   Past Medical History: PCP: none Medical Dx: HTN, headaches Meds: losartan -hydrochlorothiazide 100-25 mg previously but not currently taking Allergies: Sulfa, penicillins Hospitalizations: did not discuss Surgeries: did not discuss TBI: none Seizures: none   Family History: family history includes Bipolar disorder in his sister; Cancer in his mother; Diabetes in his sister; Other in his father.  Psychiatric Dx: Bipolar disorder in sister Suicide Hx: none Violence/Aggression: dis not discuss Substance use: did not discuss   Social History: Developmental: Father died when pt was 9. Raised by mother with cancer, who was frequently bedridden. Raised by a nanny b/c family was well off. No  other concerns Current Living Situation: Lives with girlfriend and son Education: Completed 1 year of college Occupation: Currently unemployed Hobbies: Previously played basketball, worked out at gym. Currently no  hobbies Spirituality/religiosity: did not discuss Marital Status / Relationships: has girlfriend Children: 1 son Military: no   Legal: denies any upcoming court dates.  Access to firearms: none per patient      Current Medications: Current Facility-Administered Medications  Medication Dose Route Frequency Provider Last Rate Last Admin   amLODipine (NORVASC) tablet 5 mg  5 mg Oral Daily Persis Graffius, MD   5 mg at 11/12/23 9147   clonazePAM (KLONOPIN) tablet 0.5 mg  0.5 mg Oral TID PRN Butler, Laura N, MD   0.5 mg at 11/12/23 9171   gabapentin (NEURONTIN) capsule 100 mg  100 mg Oral TID Juanmiguel Defelice, MD       hydrOXYzine  (ATARAX ) tablet 50 mg  50 mg Oral Q6H PRN Janyth Riera, MD   50 mg at 11/11/23 2110   ibuprofen (ADVIL) tablet 800 mg  800 mg Oral Q8H PRN Aylla Huffine, MD   800 mg at 11/12/23 0852   loperamide (IMODIUM) capsule 2-4 mg  2-4 mg Oral PRN Onuoha, Chinwendu V, NP       multivitamin with minerals tablet 1 tablet  1 tablet Oral Daily Onuoha, Chinwendu V, NP   1 tablet at 11/12/23 0828   nicotine  (NICODERM CQ  - dosed in mg/24 hours) patch 21 mg  21 mg Transdermal Daily McLauchlin, Angela, NP   21 mg at 11/12/23 0830   nicotine  polacrilex (NICORETTE) gum 4 mg  4 mg Oral PRN Rashard Ryle, MD   4 mg at 11/11/23 1946   OLANZapine  (ZYPREXA ) injection 10 mg  10 mg Intramuscular TID PRN Kiley Solimine, MD       OLANZapine  (ZYPREXA ) injection 5 mg  5 mg Intramuscular TID PRN Austan Nicholl, MD       OLANZapine  zydis (ZYPREXA ) disintegrating tablet 5 mg  5 mg Oral TID PRN Worthy Boschert, MD   5 mg at 11/11/23 1109   ondansetron (ZOFRAN-ODT) disintegrating tablet 4 mg  4 mg Oral Q6H PRN Onuoha, Chinwendu V, NP       QUEtiapine  (SEROQUEL  XR) 24 hr tablet 300 mg  300 mg Oral QHS Davyn Elsasser, MD       thiamine (Vitamin B-1) tablet 100 mg  100 mg Oral Daily Onuoha, Chinwendu V, NP   100 mg at 11/12/23 9171   traZODone  (DESYREL ) tablet 50 mg  50 mg Oral QHS PRN Onuoha, Chinwendu  V, NP   50 mg at 11/11/23 2108   white petrolatum (VASELINE) gel   Topical PRN Raedyn Klinck, MD        Lab Results:  Results for orders placed or performed during the hospital encounter of 11/10/23 (from the past 48 hours)  TSH     Status: None   Collection Time: 11/12/23  6:27 AM  Result Value Ref Range   TSH 0.939 0.350 - 4.500 uIU/mL    Comment: Performed at [facility], [address] [place], [place] [address]  VITAMIN D 25 Hydroxy (Vit-D Deficiency, Fractures)     Status: Abnormal   Collection Time: 11/12/23  6:27 AM  Result Value Ref Range   Vit D, 25-Hydroxy 13.63 (L) 30 - 100 ng/mL    Comment: (NOTE) Vitamin D deficiency has been defined by the Institute of Medicine  and an Endocrine Society practice guideline as a level of serum 25-OH  vitamin  D less than 20 ng/mL (1,2). The Endocrine Society went on to  further define vitamin D insufficiency as a level between 21 and 29  ng/mL (2).  1. IOM (Institute of Medicine). 2010. Dietary reference intakes for  calcium and D. [place]: [employer]. 2. Holick MF, Binkley , Bischoff-Ferrari HA, et al. Evaluation,  treatment, and prevention of vitamin D deficiency: an Endocrine  Society clinical practice guideline, JCEM. 2011 Jul; 96(7): 1911-30.  Performed at [facility], [address], [place], [place] [address]   Lipid panel     Status: Abnormal   Collection Time: 11/12/23  6:27 AM  Result Value Ref Range   Cholesterol 199 0 - 200 mg/dL    Comment:        ATP III CLASSIFICATION:  <200     mg/dL   Desirable  799-760  mg/dL   Borderline High  >=759    mg/dL   High           Triglycerides 129 <150 mg/dL   HDL 48 >59 mg/dL   Total CHOL/HDL Ratio 4.1 RATIO   VLDL 26 0 - 40 mg/dL   LDL Cholesterol 874 (H) 0 - 99 mg/dL    Comment:        Total Cholesterol/HDL:CHD Risk Coronary Heart Disease Risk Table                     Men   Women  1/2 Average Risk   3.4   3.3   Average Risk       5.0   4.4  2 X Average Risk   9.6   7.1  3 X Average Risk  23.4   11.0        Use the calculated Patient Ratio above and the CHD Risk Table to determine the patient's CHD Risk.        ATP III CLASSIFICATION (LDL):  <100     mg/dL   Optimal  899-870  mg/dL   Near or Above                    Optimal  130-159  mg/dL   Borderline  839-810  mg/dL   High  >809     mg/dL   Very High Performed at [facility], [address] [place], [place] [address]   Hemoglobin A1c     Status: None   Collection Time: 11/12/23  6:27 AM  Result Value Ref Range   Hgb A1c MFr Bld 5.0 4.8 - 5.6 %    Comment: (NOTE) Diagnosis of Diabetes The following HbA1c ranges recommended by the American Diabetes Association (ADA) may be used as an aid in the diagnosis of diabetes mellitus.  Hemoglobin             Suggested A1C NGSP%              Diagnosis  <5.7                   Non Diabetic  5.7-6.4                Pre-Diabetic  >6.4                   Diabetic  <7.0                   Glycemic control for  adults with diabetes.     Mean Plasma Glucose 96.8 mg/dL    Comment: Performed at [facility], [address] [place], [place] [address]    Blood Alcohol level:  Lab Results  Component Value Date   [facility] <15 11/09/2023   ETH <10 05/09/2022    Metabolic Labs: Lab Results  Component Value Date   HGBA1C 5.0 11/12/2023   MPG 96.8 11/12/2023   MPG 108.28 05/09/2022   Lab Results  Component Value Date   PROLACTIN 9.9 05/09/2022   Lab Results  Component Value Date   CHOL 199 11/12/2023   TRIG 129 11/12/2023   HDL 48 11/12/2023   CHOLHDL 4.1 11/12/2023   VLDL 26 11/12/2023   LDLCALC 125 (H) 11/12/2023   LDLCALC 113 (H) 05/09/2022    Physical Findings: AIMS: No   Psychiatric Specialty Exam:  Presentation  General Appearance: Wearing paper scrubs, Caucasian male, appears stated age, fair hygiene Eye  Contact:Good  Speech:Normal Rate  Speech Volume:Normal   Mood and Affect  Mood:Anxious; Depressed  Affect:-- (very anxious, congreunt, full range)   Thought Process  Thought Processes:Linear; Coherent  Descriptions of Associations:Intact  Orientation:Full (Time, Place and Person)  Thought Content:Logical   Hallucinations:Hallucinations: None  Ideas of Reference:None  Suicidal Thoughts:Suicidal Thoughts: No  Homicidal Thoughts:Homicidal Thoughts: No   Sensorium  Memory:Immediate Good; Recent Good; Remote Good  Judgment:Poor  Insight:Fair   Executive Functions  Concentration:Good  Attention Span:Good  Recall:Good  Fund of Knowledge:Good  Language:Good   Psychomotor Activity  Psychomotor Activity:Psychomotor Activity: Increased; Restlessness (no eps)   Assets  Assets:Desire for Improvement; Housing; Social Support   Sleep  Sleep:Sleep: Poor (3-4 hours, issues falling and staying alseep)    Physical Exam: Physical Exam Vitals and nursing note reviewed.  HENT:     Head: Normocephalic and atraumatic.  Pulmonary:     Effort: Pulmonary effort is normal.  Neurological:     General: No focal deficit present.     Mental Status: He is alert.  Psychiatric:     Comments: No obvious EPS.    Review of Systems  Constitutional:  Negative for diaphoresis and fever.  Cardiovascular:  Negative for chest pain and palpitations.  Gastrointestinal:  Negative for constipation, diarrhea, nausea and vomiting.  Neurological:  Negative for dizziness, tremors, weakness and headaches.   Blood pressure (!) 163/115, pulse 88, temperature 98.1 F (36.7 C), temperature source Oral, resp. rate 16, height 5' 9 (1.753 m), weight 81 kg, SpO2 100%. Body mass index is 26.37 kg/m.   ASSESSMENT & PLAN   ASSESSMENT:   Diagnoses / Active Problems:  Principal Problem:   Bipolar 1 disorder, current episode depressed, severe (HCC) Active Problems:   Insomnia   GAD  (generalized anxiety disorder)   Panic disorder   Nicotine  use disorder     [name] is a 58 year old male with history of bipolar disorder, GAD, and hospitalization 1 year ago for manic episode. Pt was brought in for attempted suicide by ingesting liquid nicotine  as reported by his girlfriend. He reports never having had thoughts of suicide or self harm. He reports wanting nicotine  intake after his e-cigarette broke, so he took a sip of the concentrated liquid nicotine , which made him ill. He lost his job a few months ago and has been unable to fill prescriptions for his psychiatric medications for 2-3 weeks.   11/12/23 Reports that the Seroquel  was not efficacious last night for sleep and is asking to have an increase so  we will go to 300 mg and monitor for side effects.  Given his severe continued anxiety discussed with patient gabapentin and he was agreeable to starting.  Will start at 100 3 times daily and titrate daily as tolerated.  Less nicotine  cravings today.  Patient appears slightly less anxious today while back on his home medications.  Patient continues to report that the intentional overdose was impulsive and not suicide attempt and does appear to not be depressed.  Will discontinue trazodone  given Seroquel  is at 300 mg now.  Persistently elevated blood pressures which do not appear transient secondary to alcohol withdrawal or other transient condition, so we will start amlodipine and titrate as indicated.  Came back vitamin D deficient so we will start supplementation.  Other labs unremarkable including TSH.   PLAN: Safety and Monitoring:             -- Involuntary admission (until 11/3) to inpatient psychiatric unit for safety, stabilization and treatment             -- Daily contact with patient to assess and evaluate symptoms and progress in treatment             -- Patient's case to be discussed in multi-disciplinary team meeting             -- Observation Level : q15 minute checks              -- Vital signs:  q12 hours             -- Precautions: suicide, elopement, and assault   2. Psychiatric Treatment:  -- Increase Seroquel  ER to 300 mg mg once daily at bedtime for bipolar disorder, GAD, insomnia -- Start gabapentin 100 mg 3 times daily for GAD             -- Continue clonazepam 0.5 mg 3 times daily as needed for anxiety             -- Continue hydroxyzine  50 mg 3 times daily as needed for anxiety             -- Discontinue trazodone  to avoid polypharmacy given Seroquel  is at 300 mg   --  The risks/benefits/side-effects/alternatives to this medication were discussed in detail with the patient and time was given for questions. The patient consents to medication trial.              -- Metabolic profile and EKG monitoring obtained while on an atypical antipsychotic. See #4 below for values.              -- Encouraged patient to participate in unit milieu and in scheduled group therapies              -- Short Term Goals: Ability to identify changes in lifestyle to reduce recurrence of condition will improve, Ability to verbalize feelings will improve, Ability to disclose and discuss suicidal ideas, Ability to demonstrate self-control will improve, Ability to identify and develop effective coping behaviors will improve, Ability to maintain clinical measurements within normal limits will improve, Compliance with prescribed medications will improve, and Ability to identify triggers associated with substance abuse/mental health issues will improve             -- Long Term Goals: Improvement in symptoms so as ready for discharge                3. Medical Issues Being Addressed:              --  Start amlodipine 5 mg once daily for hypertension; titrate as indicated  -- Start vitamin D 50,000 units once daily for vitamin D deficiency                 -- Continue PRN's: Tylenol , Maalox, Milk of Magnesia     4. Routine and other pertinent labs reviewed: EKG monitoring: QTc:  452  Metabolism / endocrine: BMI: Body mass index is 26.37 kg/m. Prolactin: Lab Results  Component Value Date   PROLACTIN 9.9 05/09/2022   Lipid Panel: Lab Results  Component Value Date   CHOL 199 11/12/2023   TRIG 129 11/12/2023   HDL 48 11/12/2023   CHOLHDL 4.1 11/12/2023   VLDL 26 11/12/2023   LDLCALC 125 (H) 11/12/2023   LDLCALC 113 (H) 05/09/2022   HbgA1c: Hgb A1c MFr Bld (%)  Date Value  11/12/2023 5.0   TSH: TSH (uIU/mL)  Date Value  11/12/2023 0.939  02/19/2017 1.010    Labs to order: n/a  5. Discharge Planning:              -- Social work and case management to assist with discharge planning and identification of hospital follow-up needs prior to discharge             -- Estimated LOS: 10/4-10/5             -- Discharge Concerns: Need to establish a safety plan; Medication compliance and effectiveness             -- Discharge Goals: Return home with outpatient referrals for mental health follow-up including medication management/psychotherapy    I certify that inpatient services furnished can reasonably be expected to improve the patient's condition.     Justino Cornish, MD PGY-2 Psychiatry Resident 11/12/2023, 12:25 PM

## 2023-11-12 NOTE — BH IP Treatment Plan (Signed)
 Interdisciplinary Treatment and Diagnostic Plan Update  11/12/2023 Time of Session: 10:05 AM [name] MRN: [id number]  Principal Diagnosis: Bipolar disorder current episode depressed (HCC)  Secondary Diagnoses: Principal Problem:   Bipolar 1 disorder, current episode depressed, severe (HCC) Active Problems:   Insomnia   GAD (generalized anxiety disorder)   Panic disorder   Nicotine  use disorder   Current Medications:  Current Facility-Administered Medications  Medication Dose Route Frequency Provider Last Rate Last Admin   amLODipine (NORVASC) tablet 5 mg  5 mg Oral Daily McCarty, Artie, MD   5 mg at 11/12/23 9147   clonazePAM (KLONOPIN) tablet 0.5 mg  0.5 mg Oral TID PRN Towana Leita SAILOR, MD   0.5 mg at 11/12/23 9171   gabapentin (NEURONTIN) capsule 100 mg  100 mg Oral TID McCarty, Artie, MD       hydrOXYzine  (ATARAX ) tablet 50 mg  50 mg Oral Q6H PRN McCarty, Artie, MD   50 mg at 11/11/23 2110   ibuprofen (ADVIL) tablet 800 mg  800 mg Oral Q8H PRN McCarty, Artie, MD   800 mg at 11/12/23 9147   loperamide (IMODIUM) capsule 2-4 mg  2-4 mg Oral PRN Onuoha, Chinwendu V, NP       multivitamin with minerals tablet 1 tablet  1 tablet Oral Daily Onuoha, Chinwendu V, NP   1 tablet at 11/12/23 0828   nicotine  (NICODERM CQ  - dosed in mg/24 hours) patch 21 mg  21 mg Transdermal Daily McLauchlin, Angela, NP   21 mg at 11/12/23 0830   nicotine  polacrilex (NICORETTE) gum 4 mg  4 mg Oral PRN McCarty, Artie, MD   4 mg at 11/11/23 1946   OLANZapine  (ZYPREXA ) injection 10 mg  10 mg Intramuscular TID PRN McCarty, Artie, MD       OLANZapine  (ZYPREXA ) injection 5 mg  5 mg Intramuscular TID PRN McCarty, Artie, MD       OLANZapine  zydis (ZYPREXA ) disintegrating tablet 5 mg  5 mg Oral TID PRN McCarty, Artie, MD   5 mg at 11/11/23 1109   ondansetron (ZOFRAN-ODT) disintegrating tablet 4 mg  4 mg Oral Q6H PRN Onuoha, Chinwendu V, NP       QUEtiapine  (SEROQUEL  XR) 24 hr tablet 300 mg  300 mg Oral QHS  McCarty, Artie, MD       thiamine (Vitamin B-1) tablet 100 mg  100 mg Oral Daily Onuoha, Chinwendu V, NP   100 mg at 11/12/23 9171   traZODone  (DESYREL ) tablet 50 mg  50 mg Oral QHS PRN Onuoha, Chinwendu V, NP   50 mg at 11/11/23 2108   [START ON 11/13/2023] Vitamin D (Ergocalciferol) (DRISDOL) 1.25 MG (50000 UNIT) capsule 50,000 Units  50,000 Units Oral Q7 days McCarty, Artie, MD       white petrolatum (VASELINE) gel   Topical PRN McCarty, Artie, MD       PTA Medications: Medications Prior to Admission  Medication Sig Dispense Refill Last Dose/Taking   clonazePAM (KLONOPIN) 0.5 MG tablet Take 0.5 mg by mouth in the morning, at noon, and at bedtime.      divalproex  (DEPAKOTE  ER) 500 MG 24 hr tablet Take 500 mg by mouth 3 (three) times daily.      DULoxetine  (CYMBALTA ) 30 MG capsule TAKE ONE CAPSULE BY MOUTH EVERY DAY (Patient taking differently: Take 30 mg by mouth daily.) 30 capsule 0    hydrOXYzine  (ATARAX ) 50 MG tablet Take 100 mg by mouth at bedtime.      losartan -hydrochlorothiazide (HYZAAR) 100-12.5 MG  tablet Take 1 tablet by mouth daily. (Patient not taking: Reported on 11/10/2023) 90 tablet 0    OLANZapine  (ZYPREXA ) 5 MG tablet Take 10 mg by mouth at bedtime.      UNABLE TO FIND Take 60 mLs by mouth once. Med Name: Nicotine  liquid       Patient Stressors: Medication change or noncompliance    Patient Strengths: Capable of independent living  Motivation for treatment/growth   Treatment Modalities: Medication Management, Group therapy, Case management,  1 to 1 session with clinician, Psychoeducation, Recreational therapy.   Physician Treatment Plan for Primary Diagnosis: Bipolar disorder current episode depressed (HCC) Long Term Goal(s):     Short Term Goals:    Medication Management: Evaluate patient's response, side effects, and tolerance of medication regimen.  Therapeutic Interventions: 1 to 1 sessions, Unit Group sessions and Medication administration.  Evaluation of  Outcomes: Not Progressing  Physician Treatment Plan for Secondary Diagnosis: Principal Problem:   Bipolar 1 disorder, current episode depressed, severe (HCC) Active Problems:   Insomnia   GAD (generalized anxiety disorder)   Panic disorder   Nicotine  use disorder  Long Term Goal(s):     Short Term Goals:       Medication Management: Evaluate patient's response, side effects, and tolerance of medication regimen.  Therapeutic Interventions: 1 to 1 sessions, Unit Group sessions and Medication administration.  Evaluation of Outcomes: Not Progressing   RN Treatment Plan for Primary Diagnosis: Bipolar disorder current episode depressed (HCC) Long Term Goal(s): Knowledge of disease and therapeutic regimen to maintain health will improve  Short Term Goals: Ability to remain free from injury will improve, Ability to verbalize frustration and anger appropriately will improve, Ability to demonstrate self-control, Ability to participate in decision making will improve, Ability to verbalize feelings will improve, Ability to disclose and discuss suicidal ideas, Ability to identify and develop effective coping behaviors will improve, and Compliance with prescribed medications will improve  Medication Management: RN will administer medications as ordered by provider, will assess and evaluate patient's response and provide education to patient for prescribed medication. RN will report any adverse and/or side effects to prescribing provider.  Therapeutic Interventions: 1 on 1 counseling sessions, Psychoeducation, Medication administration, Evaluate responses to treatment, Monitor vital signs and CBGs as ordered, Perform/monitor CIWA, COWS, AIMS and Fall Risk screenings as ordered, Perform wound care treatments as ordered.  Evaluation of Outcomes: Not Progressing   LCSW Treatment Plan for Primary Diagnosis: Bipolar disorder current episode depressed (HCC) Long Term Goal(s): Safe transition to  appropriate next level of care at discharge, Engage patient in therapeutic group addressing interpersonal concerns.  Short Term Goals: Engage patient in aftercare planning with referrals and resources, Increase social support, Increase ability to appropriately verbalize feelings, Increase emotional regulation, Facilitate acceptance of mental health diagnosis and concerns, Facilitate patient progression through stages of change regarding substance use diagnoses and concerns, Identify triggers associated with mental health/substance abuse issues, and Increase skills for wellness and recovery  Therapeutic Interventions: Assess for all discharge needs, 1 to 1 time with Social worker, Explore available resources and support systems, Assess for adequacy in community support network, Educate family and significant other(s) on suicide prevention, Complete Psychosocial Assessment, Interpersonal group therapy.  Evaluation of Outcomes: Not Progressing   Progress in Treatment: Attending groups: Yes. Participating in groups: Yes. Taking medication as prescribed: Yes. Toleration medication: Yes. Family/Significant other contact made: No, will contact:  Sari Breen (girlfriend) [phone] Patient understands diagnosis: Yes. Discussing patient identified problems/goals with staff: Yes. Medical  problems stabilized or resolved: Yes. Denies suicidal/homicidal ideation: Yes. Issues/concerns per patient self-inventory: No.  New problem(s) identified:  No  New Short Term/Long Term Goal(s):   medication stabilization, elimination of SI thoughts, development of comprehensive mental wellness plan.    Patient Goals:  I want to participate more in groups.    Discharge Plan or Barriers:  Patient recently admitted. CSW will continue to follow and assess for appropriate referrals and possible discharge planning.    Reason for Continuation of Hospitalization: Anxiety Medication stabilization Suicidal  ideation  Estimated Length of Stay:  5 - 7 days  Last 3 [place] Suicide Severity Risk Score: Flowsheet Row [facility] (Current) [facility] from 11/09/2023 in [facility] ED from 05/09/2022 in [facility]  C-SSRS RISK CATEGORY No Risk No Risk No Risk    Last [facility] 2/9 Scores:    02/19/2017    8:25 AM 11/15/2015   11:04 AM  Depression screen PHQ 2/9  Decreased Interest 0 3  Down, Depressed, Hopeless 0 3  PHQ - 2 Score 0 6  Altered sleeping  1  Tired, decreased energy  1  Change in appetite  2  Feeling bad or failure about yourself   2  Trouble concentrating  2  Moving slowly or fidgety/restless  0  Suicidal thoughts  0   PHQ-9 Score  14     Data saved with a previous flowsheet row definition    Scribe for Treatment Team: Ramil Edgington O Mathews Stuhr, LCSWA 11/12/2023 12:35 PM

## 2023-11-12 NOTE — Group Note (Signed)
 Recreation Therapy Group Note   Group Topic:Problem Solving  Group Date: 11/12/2023 Start Time: 0934 End Time: 1000 Facilitators: Cesiah Westley-McCall, LRT,CTRS Location: [address] Dayroom   Group Topic: Halloween Trivia  Goal Area(s) Addresses:  Patient will effectively work in a team with other group members. Patient will verbalize importance of using appropriate problem solving techniques.   Behavioral Response: Engaged  Intervention: Trivia  Activity: Set Designer. Patients worked together in teams. LRT read questions from 6 different categories (Classic Horror, Monster Movies, Slasher Flicks, Behind-the-Scenes, Caution! Plot and Beware the Grab Bag). Each group was to right down their answers. LRT would go over the answers with patients at the end of each category. The team with the most combined points (point total from each category) wins the game.    Education: Journalist, Newspaper, Team Building, Communication  Education Outcome: Acknowledges understanding/In group clarification offered/Needs additional education.    Affect/Mood: Appropriate   Participation Level: Engaged   Participation Quality: Independent   Behavior: Appropriate   Speech/Thought Process: Focused   Insight: Good   Judgement: Good   Modes of Intervention: Competitive Play   Patient Response to Interventions:  Engaged   Education Outcome:  In group clarification offered    Clinical Observations/Individualized Feedback: Pt was bright and engaged throughout group. Pt worked well with peers in coming up with and figuring out their answers. Pt was attentive and active throughout group.      Plan: Continue to engage patient in RT group sessions 2-3x/week.   Shanekqua Schaper-McCall, LRT,CTRS 11/12/2023 12:23 PM

## 2023-11-12 NOTE — Progress Notes (Signed)
   11/12/23 1200  Psych Admission Type (Psych Patients Only)  Admission Status Involuntary  Psychosocial Assessment  Patient Complaints Anxiety;Nervousness  Eye Contact Fair  Facial Expression Anxious  Affect Anxious  Speech Logical/coherent  Interaction Assertive  Motor Activity Slow  Appearance/Hygiene Unremarkable  Behavior Characteristics Cooperative;Anxious  Mood Anxious  Thought Process  Coherency WDL  Content WDL  Delusions None reported or observed  Perception WDL  Hallucination None reported or observed  Judgment WDL  Confusion None  Danger to Self  Current suicidal ideation? Denies  Danger to Others  Danger to Others None reported or observed

## 2023-11-12 NOTE — Progress Notes (Signed)
 Pt denied every being nor currently suicidal despite what the earlier report was. He was guarded in sharing what precipitated his visit here either not remembering or hesitant to share what happened.

## 2023-11-12 NOTE — Plan of Care (Signed)
  Problem: Education: Goal: Emotional status will improve Outcome: Progressing Goal: Verbalization of understanding the information provided will improve Outcome: Progressing   Problem: Activity: Goal: Interest or engagement in activities will improve Outcome: Progressing   

## 2023-11-13 DIAGNOSIS — G4700 Insomnia, unspecified: Secondary | ICD-10-CM | POA: Diagnosis not present

## 2023-11-13 DIAGNOSIS — F411 Generalized anxiety disorder: Secondary | ICD-10-CM | POA: Diagnosis not present

## 2023-11-13 DIAGNOSIS — F314 Bipolar disorder, current episode depressed, severe, without psychotic features: Secondary | ICD-10-CM | POA: Diagnosis not present

## 2023-11-13 DIAGNOSIS — F410 Panic disorder [episodic paroxysmal anxiety] without agoraphobia: Secondary | ICD-10-CM | POA: Diagnosis not present

## 2023-11-13 MED ORDER — AMLODIPINE BESYLATE 5 MG PO TABS
10.0000 mg | ORAL_TABLET | Freq: Every day | ORAL | Status: DC
  Administered 2023-11-13 – 2023-11-15 (×3): 10 mg via ORAL
  Filled 2023-11-13: qty 2
  Filled 2023-11-13: qty 10
  Filled 2023-11-13 (×2): qty 2

## 2023-11-13 MED ORDER — LOSARTAN POTASSIUM 50 MG PO TABS
50.0000 mg | ORAL_TABLET | Freq: Every day | ORAL | Status: DC
  Administered 2023-11-13 – 2023-11-15 (×3): 50 mg via ORAL
  Filled 2023-11-13 (×3): qty 1
  Filled 2023-11-13: qty 10

## 2023-11-13 MED ORDER — GABAPENTIN 100 MG PO CAPS
200.0000 mg | ORAL_CAPSULE | Freq: Three times a day (TID) | ORAL | Status: DC
  Administered 2023-11-13 – 2023-11-14 (×4): 200 mg via ORAL
  Filled 2023-11-13 (×4): qty 2

## 2023-11-13 NOTE — Progress Notes (Signed)
 [facility] MD Progress Note  11/13/2023 10:40 AM [name]  MRN:  [id number]  Principal Problem: Bipolar disorder current episode depressed (HCC) Diagnosis: Principal Problem:   Bipolar 1 disorder, current episode depressed, severe (HCC) Active Problems:   Insomnia   GAD (generalized anxiety disorder)   Panic disorder   Nicotine  use disorder   Reason for Admission:  [name] is a 58 y.o. male  with a past psychiatric history of bipolar disorder, depression, anxiety. Patient initially arrived to [facility] on 11/09/2023 for reported suicide attempt, and admitted to [facility] under IVC on 11/10/2023 for acute safety concerns, crisis stabalization, acute suicidal or self-harming behaviors, impaired functioning, and stabilization of acute on chronic psychiatric conditions. PMHx is significant for HTN, insomnia, headaches.  (admitted on 11/10/2023, total  LOS: 3 days )   last 24 hours:  BP 153/104.  As needed clonazepam 3 times daily.  Clonidine as needed x 2 yesterday evening.  Hydroxyzine  as needed 3 times daily.  Tylenol  x 2 for headache.  Slept 6.75 hours per nursing.  Per RN high anxiety.   Information Obtained Today During Patient Interview:  Reports that he was sleeping better with increased dose of Seroquel .  Reports some tiredness in the morning but not significant.  Reports that the benefits outweigh the risk with the Seroquel  and that he wants to continue taking this.  Reports that he would like to go up on the gabapentin today.  Reports that he feels like his mood is stable and is continue denies suicidal thoughts and is asking to leave tomorrow.  Reports that he has not talked to his girlfriend since admission but that he will try to talk to her today.  Reports that he is eating well.     Past Psychiatric Hx: Current Psychiatrist: Rosslyn Prost, [facility] Current Therapist: none Previous Psychiatric Diagnoses: Bipolar I, GAD  Psychiatric  Medications: Current Olanzapine  5 mg once day Hydroxyzine  50 mg TID PRN Divalproex  ER 500 mg three times daily Clonazepam 0.5 mg TID PRN Past Quetiapine  50 mg  Eszopiclone 2 mg Brexpiprazole 1 mg Aripiprazole 2 mg Psychiatric Hospitalization hx: hospitalized 1 year ago for manic episode Psychotherapy hx: no history of therapy ACT team hx: none Neuromodulation history: none  History of suicide: none  History of homicide or aggression: none   Substance Use Hx: Alcohol: None Nicotine : 24 mg daily via nicotine  vape, not interested in cutting back Cannabis: None Other Substances: denies Non-prescribed medications: none Rehab History: none   Past Medical History: PCP: none Medical Dx: HTN, headaches Meds: losartan -hydrochlorothiazide 100-25 mg previously but not currently taking Allergies: Sulfa, penicillins Hospitalizations: did not discuss Surgeries: did not discuss TBI: none Seizures: none   Family History: family history includes Bipolar disorder in his sister; Cancer in his mother; Diabetes in his sister; Other in his father.  Psychiatric Dx: Bipolar disorder in sister Suicide Hx: none Violence/Aggression: dis not discuss Substance use: did not discuss   Social History: Developmental: Father died when pt was 9. Raised by mother with cancer, who was frequently bedridden. Raised by a nanny b/c family was well off. No other concerns Current Living Situation: Lives with girlfriend and son Education: Completed 1 year of college Occupation: Currently unemployed Hobbies: Previously played basketball, worked out at gym. Currently no hobbies Spirituality/religiosity: did not discuss Marital Status / Relationships: has girlfriend Children: 1 son Military: no   Legal: denies any upcoming court dates.  Access to firearms: none per patient  Current Medications: Current Facility-Administered Medications  Medication Dose Route Frequency Provider Last Rate Last Admin    acetaminophen  (TYLENOL ) tablet 1,000 mg  1,000 mg Oral Q6H PRN Davonne Baby, MD   1,000 mg at 11/12/23 1811   alum & mag hydroxide-simeth (MAALOX/MYLANTA) 200-200-20 MG/5ML suspension 30 mL  30 mL Oral Q4H PRN Nayali Talerico, MD       amLODipine (NORVASC) tablet 10 mg  10 mg Oral Daily Hargis Vandyne, MD   10 mg at 11/13/23 9176   clonazePAM (KLONOPIN) tablet 0.5 mg  0.5 mg Oral TID PRN Butler, Laura N, MD   0.5 mg at 11/13/23 0825   cloNIDine (CATAPRES) tablet 0.1 mg  0.1 mg Oral BID PRN Towana Leita SAILOR, MD   0.1 mg at 11/12/23 2101   gabapentin (NEURONTIN) capsule 200 mg  200 mg Oral TID Vivek Grealish, MD       hydrOXYzine  (ATARAX ) tablet 50 mg  50 mg Oral Q6H PRN Mikala Podoll, MD   50 mg at 11/12/23 2101   ibuprofen (ADVIL) tablet 800 mg  800 mg Oral Q8H PRN Refael Fulop, MD   800 mg at 11/12/23 2101   loperamide (IMODIUM) capsule 2-4 mg  2-4 mg Oral PRN Onuoha, Chinwendu V, NP       losartan  (COZAAR ) tablet 50 mg  50 mg Oral Daily Jaydrien Wassenaar, MD   50 mg at 11/13/23 0825   multivitamin with minerals tablet 1 tablet  1 tablet Oral Daily Onuoha, Chinwendu V, NP   1 tablet at 11/13/23 9176   nicotine  (NICODERM CQ  - dosed in mg/24 hours) patch 21 mg  21 mg Transdermal Daily McLauchlin, Angela, NP   21 mg at 11/13/23 9175   nicotine  polacrilex (NICORETTE) gum 4 mg  4 mg Oral PRN Royelle Hinchman, MD   4 mg at 11/13/23 9087   OLANZapine  (ZYPREXA ) injection 10 mg  10 mg Intramuscular TID PRN Sheron Robin, MD       OLANZapine  (ZYPREXA ) injection 5 mg  5 mg Intramuscular TID PRN Coda Filler, MD       OLANZapine  zydis (ZYPREXA ) disintegrating tablet 5 mg  5 mg Oral TID PRN Hever Castilleja, MD   5 mg at 11/11/23 1109   ondansetron (ZOFRAN-ODT) disintegrating tablet 4 mg  4 mg Oral Q6H PRN Onuoha, Chinwendu V, NP       QUEtiapine  (SEROQUEL  XR) 24 hr tablet 300 mg  300 mg Oral QHS Bryssa Tones, MD   300 mg at 11/12/23 2102   thiamine (Vitamin B-1) tablet 100 mg  100 mg Oral Daily Onuoha,  Chinwendu V, NP   100 mg at 11/13/23 9176   Vitamin D (Ergocalciferol) (DRISDOL) 1.25 MG (50000 UNIT) capsule 50,000 Units  50,000 Units Oral Q7 days Ajanay Farve, MD   50,000 Units at 11/13/23 9176   white petrolatum (VASELINE) gel   Topical PRN Cornelius Dines, MD   1 Application at 11/12/23 1318    Lab Results:  Results for orders placed or performed during the hospital encounter of 11/10/23 (from the past 48 hours)  TSH     Status: None   Collection Time: 11/12/23  6:27 AM  Result Value Ref Range   TSH 0.939 0.350 - 4.500 uIU/mL    Comment: Performed at [facility], [address], [place], [place] [address]  VITAMIN D 25 Hydroxy (Vit-D Deficiency, Fractures)     Status: Abnormal   Collection Time: 11/12/23  6:27 AM  Result Value Ref Range  Vit D, 25-Hydroxy 13.63 (L) 30 - 100 ng/mL    Comment: (NOTE) Vitamin D deficiency has been defined by the Institute of Medicine  and an Endocrine Society practice guideline as a level of serum 25-OH  vitamin D less than 20 ng/mL (1,2). The Endocrine Society went on to  further define vitamin D insufficiency as a level between 21 and 29  ng/mL (2).  1. IOM (Institute of Medicine). 2010. Dietary reference intakes for  calcium and D. [place]: [employer]. 2. Holick MF, Binkley [place], Bischoff-Ferrari HA, et al. Evaluation,  treatment, and prevention of vitamin D deficiency: an Endocrine  Society clinical practice guideline, JCEM. 2011 Jul; 96(7): 1911-30.  Performed at [facility], [address] [place], [place] [address]   Lipid panel     Status: Abnormal   Collection Time: 11/12/23  6:27 AM  Result Value Ref Range   Cholesterol 199 0 - 200 mg/dL    Comment:        ATP III CLASSIFICATION:  <200     mg/dL   Desirable  799-760  mg/dL   Borderline High  >=759    mg/dL   High           Triglycerides 129 <150 mg/dL   HDL 48 >59 mg/dL   Total CHOL/HDL Ratio 4.1 RATIO   VLDL 26 0 - 40  mg/dL   LDL Cholesterol 874 (H) 0 - 99 mg/dL    Comment:        Total Cholesterol/HDL:CHD Risk Coronary Heart Disease Risk Table                     Men   Women  1/2 Average Risk   3.4   3.3  Average Risk       5.0   4.4  2 X Average Risk   9.6   7.1  3 X Average Risk  23.4   11.0        Use the calculated Patient Ratio above and the CHD Risk Table to determine the patient's CHD Risk.        ATP III CLASSIFICATION (LDL):  <100     mg/dL   Optimal  899-870  mg/dL   Near or Above                    Optimal  130-159  mg/dL   Borderline  839-810  mg/dL   High  >809     mg/dL   Very High Performed at [facility], [address], [place], [place] [address]   Hemoglobin A1c     Status: None   Collection Time: 11/12/23  6:27 AM  Result Value Ref Range   Hgb A1c MFr Bld 5.0 4.8 - 5.6 %    Comment: (NOTE) Diagnosis of Diabetes The following HbA1c ranges recommended by the American Diabetes Association (ADA) may be used as an aid in the diagnosis of diabetes mellitus.  Hemoglobin             Suggested A1C NGSP%              Diagnosis  <5.7                   Non Diabetic  5.7-6.4                Pre-Diabetic  >6.4  Diabetic  <7.0                   Glycemic control for                       adults with diabetes.     Mean Plasma Glucose 96.8 mg/dL    Comment: Performed at [facility], [address] [place], [place] [address]    Blood Alcohol level:  Lab Results  Component Value Date   [facility] <15 11/09/2023   ETH <10 05/09/2022    Metabolic Labs: Lab Results  Component Value Date   HGBA1C 5.0 11/12/2023   MPG 96.8 11/12/2023   MPG 108.28 05/09/2022   Lab Results  Component Value Date   PROLACTIN 9.9 05/09/2022   Lab Results  Component Value Date   CHOL 199 11/12/2023   TRIG 129 11/12/2023   HDL 48 11/12/2023   CHOLHDL 4.1 11/12/2023   VLDL 26 11/12/2023   LDLCALC 125 (H) 11/12/2023   LDLCALC 113 (H) 05/09/2022     Physical Findings: AIMS: No   Psychiatric Specialty Exam:  Presentation  General Appearance: Wearing paper scrubs, Caucasian male, appears stated age, fair hygiene Eye Contact:Good  Speech:Normal Rate  Speech Volume:Normal   Mood and Affect  Mood:Anxious; Depressed  Affect:-- (very anxious, congreunt, full range)   Thought Process  Thought Processes:Linear; Coherent  Descriptions of Associations:Intact  Orientation:Full (Time, Place and Person)  Thought Content:Logical   Hallucinations: Denies  Ideas of Reference:None  Suicidal Thoughts: Denies  Homicidal Thoughts: Denie denies Sensorium  Memory:Immediate Good; Recent Good; Remote Good  Judgment:Poor  Insight:Fair   Executive Functions  Concentration:Good  Attention Span:Good  Recall:Good  Fund of Knowledge:Good  Language:Good   Psychomotor Activity  Psychomotor Activity: Normal   Assets  Assets:Desire for Improvement; Housing; Social Support   Sleep  Sleep: Good with Seroquel     Physical Exam: Physical Exam Vitals and nursing note reviewed.  HENT:     Head: Normocephalic and atraumatic.  Pulmonary:     Effort: Pulmonary effort is normal.  Neurological:     General: No focal deficit present.     Mental Status: He is alert.  Psychiatric:     Comments: No obvious EPS.    Review of Systems  Constitutional:  Negative for diaphoresis and fever.  Cardiovascular:  Negative for chest pain and palpitations.  Gastrointestinal:  Negative for constipation, diarrhea, nausea and vomiting.  Neurological:  Negative for dizziness, tremors, weakness and headaches.   Blood pressure (!) 153/104, pulse 85, temperature 97.7 F (36.5 C), temperature source Oral, resp. rate 16, height 5' 9 (1.753 m), weight 81 kg, SpO2 99%. Body mass index is 26.37 kg/m.   ASSESSMENT & PLAN   ASSESSMENT:   Diagnoses / Active Problems:  Principal Problem:   Bipolar 1 disorder, current episode  depressed, severe (HCC) Active Problems:   Insomnia   GAD (generalized anxiety disorder)   Panic disorder   Nicotine  use disorder     Albaraa is a 58 year old male with history of bipolar disorder, GAD, and hospitalization 1 year ago for manic episode. Pt was brought in for attempted suicide by ingesting liquid nicotine  as reported by his girlfriend. He reports never having had thoughts of suicide or self harm. He reports wanting nicotine  intake after his e-cigarette broke, so he took a sip of the concentrated liquid nicotine , which made him ill. He lost his job a few months ago and has  been unable to fill prescriptions for his psychiatric medications for 2-3 weeks.   11/13/23 Sleep benefits with 30 mg so we will continue this as his mood stabilizer and insomnia medication.  Tolerating gabapentin so increase to 200 3 times daily and likely increase again tomorrow if tolerating to get him to a more therapeutic dose for anxiety.  Blood pressures are still elevated so we will increase amlodipine and add second agent of losartan  given he reports not liking thiazides due to peeing a lot.    PLAN: Safety and Monitoring:             -- Involuntary admission (until 11/3) to inpatient psychiatric unit for safety, stabilization and treatment             -- Daily contact with patient to assess and evaluate symptoms and progress in treatment             -- Patient's case to be discussed in multi-disciplinary team meeting             -- Observation Level : q15 minute checks             -- Vital signs:  q12 hours             -- Precautions: suicide, elopement, and assault   2. Psychiatric Treatment:  -- Continue Seroquel  ER 300 mg mg once daily at bedtime for bipolar disorder, GAD, insomnia -- Increase gabapentin from 100 to 200 mg 3 times daily for GAD             -- Continue clonazepam 0.5 mg 3 times daily as needed for anxiety             -- Continue hydroxyzine  50 mg 3 times daily as needed for  anxiety   --  The risks/benefits/side-effects/alternatives to this medication were discussed in detail with the patient and time was given for questions. The patient consents to medication trial.              -- Metabolic profile and EKG monitoring obtained while on an atypical antipsychotic. See #4 below for values.              -- Encouraged patient to participate in unit milieu and in scheduled group therapies              -- Short Term Goals: Ability to identify changes in lifestyle to reduce recurrence of condition will improve, Ability to verbalize feelings will improve, Ability to disclose and discuss suicidal ideas, Ability to demonstrate self-control will improve, Ability to identify and develop effective coping behaviors will improve, Ability to maintain clinical measurements within normal limits will improve, Compliance with prescribed medications will improve, and Ability to identify triggers associated with substance abuse/mental health issues will improve             -- Long Term Goals: Improvement in symptoms so as ready for discharge                3. Medical Issues Being Addressed:              -- Increase amlodipine from 5 to 10 mg once daily for hypertension  -- Start losartan  50 mg once daily for hypertension  -- Continue vitamin D 50,000 units once daily for vitamin D deficiency                 -- Continue PRN's: Tylenol , Maalox, Milk of Magnesia     4.  Routine and other pertinent labs reviewed: EKG monitoring: QTc: 452  Metabolism / endocrine: BMI: Body mass index is 26.37 kg/m. Prolactin: Lab Results  Component Value Date   PROLACTIN 9.9 05/09/2022   Lipid Panel: Lab Results  Component Value Date   CHOL 199 11/12/2023   TRIG 129 11/12/2023   HDL 48 11/12/2023   CHOLHDL 4.1 11/12/2023   VLDL 26 11/12/2023   LDLCALC 125 (H) 11/12/2023   LDLCALC 113 (H) 05/09/2022   HbgA1c: Hgb A1c MFr Bld (%)  Date Value  11/12/2023 5.0   TSH: TSH (uIU/mL)  Date  Value  11/12/2023 0.939  02/19/2017 1.010    Labs to order: n/a  5. Discharge Planning:              -- Social work and case management to assist with discharge planning and identification of hospital follow-up needs prior to discharge             -- Estimated LOS: 11/3             -- Discharge Concerns: Need to establish a safety plan; Medication compliance and effectiveness             -- Discharge Goals: Return home with outpatient referrals for mental health follow-up including medication management/psychotherapy    I certify that inpatient services furnished can reasonably be expected to improve the patient's condition.     Matthew Cornish, MD PGY-2 Psychiatry Resident 11/13/2023, 10:40 AM

## 2023-11-13 NOTE — [facility] Counselor (Signed)
 [name] attended the social work group today, 11/13/23 from 1000-1100 and he left about 20 minutes into it.

## 2023-11-13 NOTE — Group Note (Signed)
 Date:  11/13/2023 Time:  4:59 PM  Group Topic/Focus:  Self Care:   The focus of this group is to help patients understand the importance of self-care in order to improve or restore emotional, physical, spiritual, interpersonal, and financial health.    Participation Level:  Active  Participation Quality:  Appropriate  Affect:  Appropriate  Cognitive:  Appropriate  Insight: Appropriate  Engagement in Group:  Engaged  Modes of Intervention:  Discussion and Education  Additional Comments:    [name] 11/13/2023, 4:59 PM

## 2023-11-13 NOTE — Plan of Care (Signed)
  Problem: Education: Goal: Knowledge of [facility] Education information/materials will improve Outcome: Progressing Goal: Verbalization of understanding the information provided will improve Outcome: Progressing   Problem: Activity: Goal: Interest or engagement in activities will improve Outcome: Progressing Goal: Sleeping patterns will improve Outcome: Progressing

## 2023-11-13 NOTE — Group Note (Signed)
 Date:  11/13/2023 Time:  6:12 PM  Group Topic/Focus:  Emotional Wellness  The group session centered on anger management and coping mechanisms, beginning with a TED Talk that provided insights on shifting perspectives and effectively managing anger. After the video, the group engaged in a discussion about the anger cycle, exploring how anger develops and the emotional and physiological responses that accompany it. Patients also examined common triggers of anger and how to identify these triggers in their own lives. The group then focused on distinguishing between healthy and unhealthy coping mechanisms, with a particular emphasis on strategies that promote emotional regulation and positive outcomes.  Throughout the discussion, patients had the opportunity to collaborate by sharing personal experiences and brainstorming a variety of coping strategies. This collective sharing allowed participants to gain new insights and refine their own approaches to managing anger. The group dynamic fostered a supportive environment where patients could learn from each other's experiences, providing both validation and constructive feedback on coping techniques. The session aimed to enhance self-awareness, promote emotional resilience, and encourage the use of adaptive coping strategies in real-life situations.  Participation Level:  Did Not Attend  Participation Quality:  N/A  Affect:  N/A  Cognitive:  N/A  Insight: None  Engagement in Group:  None  Modes of Intervention:  N/A  Additional Comments:  Aadon did not attend this emotional wellness group.  Kristi HERO Romana Deaton 11/13/2023, 6:12 PM

## 2023-11-13 NOTE — Group Note (Signed)
 [facility] LCSW Group Therapy Note   Group Date: 11/13/2023 Start Time: 1000 End Time: 1100  Participation Level:  Minimal   Type of Therapy/Topic:  Group Therapy:  Sleep Hygiene and Healthy Waking Habits  Participation Level:  Minimal   Description of Group:    This group will address the concept of Sleep Hygiene and Healthy Waking Habits and how it feels and looks when one is unbalanced. Patients will be encouraged to process areas in their lives that are out of balance, and identify reasons for remaining unbalanced. Facilitators will guide patients utilizing problem- solving interventions to address and correct the stressor making their Sleep Hygiene and Waking Habit unbalanced. Understanding and applying new  Sleep Hygiene techniques will be explored and addressed for obtaining  and maintaining a balanced life.   Therapeutic Goals: Patient will identify Healthy Habits to maintain good Sleep Hygiene and Waking routine.  Patient will demonstrate ability to communicate their needs through discussion.     Summary of Patient Progress:    Pt attended group, participated minimally, and left early.    Therapeutic Modalities:   Cognitive Behavioral Therapy Solution-Focused Therapy    Camelia Olden, LCSW

## 2023-11-13 NOTE — Plan of Care (Signed)
   Problem: Education: Goal: Knowledge of [place] [facility] Education information/materials will improve Outcome: Progressing Goal: Emotional status will improve Outcome: Progressing Goal: Mental status will improve Outcome: Progressing

## 2023-11-13 NOTE — Progress Notes (Signed)
   11/13/23 0820  Psych Admission Type (Psych Patients Only)  Admission Status Involuntary  Psychosocial Assessment  Patient Complaints Anxiety  Eye Contact Fair  Facial Expression Anxious  Affect Anxious  Speech Logical/coherent  Interaction Assertive  Motor Activity Slow  Appearance/Hygiene Unremarkable  Behavior Characteristics Cooperative  Mood Anxious  Thought Process  Coherency WDL  Content WDL  Delusions None reported or observed  Perception WDL  Hallucination None reported or observed  Judgment WDL  Confusion None  Danger to Self  Current suicidal ideation? Denies  Agreement Not to Harm Self Yes  Description of Agreement verbal  Danger to Others  Danger to Others None reported or observed

## 2023-11-13 NOTE — Progress Notes (Signed)
 Adult Psychoeducational Group Note  Date:  11/13/2023 Time:  9:39 PM  Group Topic/Focus:  Wrap-Up Group:   The focus of this group is to help patients review their daily goal of treatment and discuss progress on daily workbooks.  Participation Level:  Active  Participation Quality:  Appropriate  Affect:  Appropriate  Cognitive:  Appropriate  Insight: Appropriate  Engagement in Group:  Engaged  Modes of Intervention:  Discussion  Additional Comments:  Pt goal for the day was to try to attend most groups. Pt met goal.  [name] 11/13/2023, 9:39 PM

## 2023-11-13 NOTE — Group Note (Signed)
 Date:  11/13/2023 Time:  3:55 PM  Group Topic/Focus:  Social Wellness  Patients engaged in a bingo game, promoting social interaction and active participation. For patients who chose not to participate in the game, a movie played in the background, allowing them to remain in a social setting without direct involvement in the activity.  The structure of the bingo game supports social wellness by facilitating communication, turn-taking, and group cohesion, while also offering a fun and engaging way to connect with peers. Additionally, the option to watch the movie allowed individuals to remain engaged in the social setting at their own comfort level, enhancing the sense of community and reducing feelings of isolation.  Participation Level:  Active  Participation Quality:  Appropriate and Attentive  Affect:  Appropriate  Cognitive:  Alert and Appropriate  Insight: Appropriate and Improving  Engagement in Group:  Engaged and Improving  Modes of Intervention:  Activity and Socialization  Additional Comments:  Filmore did not participate  in bingo but instead watched a movie and socialized with his peers.  Kristi HERO Atzel Mccambridge 11/13/2023, 3:55 PM

## 2023-11-13 NOTE — Group Note (Signed)
 Date:  11/13/2023 Time:  9:03 AM  Group Topic/Focus:  Goals Group:   The focus of this group is to help patients establish daily goals to achieve during treatment and discuss how the patient can incorporate goal setting into their daily lives to aide in recovery.    Participation Level:  Active  Participation Quality:  Appropriate, Attentive, and Sharing  Affect:  Appropriate  Cognitive:  Alert and Appropriate  Insight: Appropriate and Improving  Engagement in Group:  Engaged and Improving  Modes of Intervention:  Discussion, Exploration, and Socialization  Additional Comments:  Little attended and actively participated in goals group.  Kristi HERO Letrice Pollok 11/13/2023, 9:03 AM

## 2023-11-14 DIAGNOSIS — G4700 Insomnia, unspecified: Secondary | ICD-10-CM | POA: Diagnosis not present

## 2023-11-14 DIAGNOSIS — F411 Generalized anxiety disorder: Secondary | ICD-10-CM | POA: Diagnosis not present

## 2023-11-14 DIAGNOSIS — F314 Bipolar disorder, current episode depressed, severe, without psychotic features: Secondary | ICD-10-CM | POA: Diagnosis not present

## 2023-11-14 DIAGNOSIS — F410 Panic disorder [episodic paroxysmal anxiety] without agoraphobia: Secondary | ICD-10-CM | POA: Diagnosis not present

## 2023-11-14 MED ORDER — LOSARTAN POTASSIUM 50 MG PO TABS: 50.0000 mg | ORAL_TABLET | Freq: Every day | ORAL | 0 refills | Status: AC

## 2023-11-14 MED ORDER — NICOTINE POLACRILEX 4 MG MT GUM: 4.0000 mg | CHEWING_GUM | OROMUCOSAL | Status: AC | PRN

## 2023-11-14 MED ORDER — MENTHOL 3 MG MT LOZG
1.0000 | LOZENGE | Freq: Three times a day (TID) | OROMUCOSAL | Status: DC | PRN
  Administered 2023-11-14: 3 mg via ORAL
  Filled 2023-11-14: qty 9

## 2023-11-14 MED ORDER — VITAMIN D (ERGOCALCIFEROL) 1.25 MG (50000 UNIT) PO CAPS: 50000.0000 [IU] | ORAL_CAPSULE | ORAL | 0 refills | Status: AC

## 2023-11-14 MED ORDER — GABAPENTIN 300 MG PO CAPS: 300.0000 mg | ORAL_CAPSULE | Freq: Three times a day (TID) | ORAL | 0 refills | Status: DC

## 2023-11-14 MED ORDER — GABAPENTIN 300 MG PO CAPS: 300.0000 mg | ORAL_CAPSULE | Freq: Three times a day (TID) | ORAL | Status: DC

## 2023-11-14 MED ORDER — LORATADINE 10 MG PO TABS
10.0000 mg | ORAL_TABLET | Freq: Every day | ORAL | Status: DC
  Administered 2023-11-14 – 2023-11-15 (×2): 10 mg via ORAL
  Filled 2023-11-14 (×2): qty 1

## 2023-11-14 MED ORDER — AMLODIPINE BESYLATE 10 MG PO TABS: 10.0000 mg | ORAL_TABLET | Freq: Every day | ORAL | 0 refills | Status: AC

## 2023-11-14 MED ORDER — QUETIAPINE FUMARATE ER 300 MG PO TB24: 300.0000 mg | ORAL_TABLET | Freq: Every day | ORAL | 0 refills | Status: DC

## 2023-11-14 MED ORDER — GABAPENTIN 300 MG PO CAPS
300.0000 mg | ORAL_CAPSULE | Freq: Three times a day (TID) | ORAL | Status: DC
  Administered 2023-11-14 – 2023-11-15 (×3): 300 mg via ORAL
  Filled 2023-11-14: qty 1
  Filled 2023-11-14: qty 30
  Filled 2023-11-14 (×2): qty 1

## 2023-11-14 MED ORDER — NICOTINE 21 MG/24HR TD PT24: 21.0000 mg | MEDICATED_PATCH | Freq: Every day | TRANSDERMAL | Status: AC

## 2023-11-14 MED ORDER — BENZONATATE 100 MG PO CAPS
200.0000 mg | ORAL_CAPSULE | Freq: Two times a day (BID) | ORAL | Status: DC | PRN
  Administered 2023-11-14: 200 mg via ORAL
  Filled 2023-11-14: qty 2

## 2023-11-14 NOTE — Progress Notes (Addendum)
 [name] continues to present anxious, received clonazepam after lunch per Cervantes's request, rated anxiety a 9/10. Cervantes also complaining of headache, congestion, and cough, stating that he feels as though he is coming down with something. Cervantes given prn medications for relief, and encouraged to rest and increase his water intake. Cervantes currently denies SI/HI and AVH and doesn't appear to be responding to internal stimuli. A. Labs and vitals monitored. Cervantes given and educated on medications. Cervantes supported emotionally and encouraged to express concerns and ask questions.   R. Cervantes remains safe with 15 minute checks. Will continue POC.    11/14/23 1300  Psych Admission Type (Psych Patients Only)  Admission Status Involuntary  Psychosocial Assessment  Patient Complaints Anxiety  Eye Contact Fair  Facial Expression Anxious  Affect Anxious  Speech Logical/coherent  Interaction Needy;Assertive  Motor Activity Slow  Appearance/Hygiene Unremarkable  Behavior Characteristics Cooperative;Anxious  Mood Anxious  Thought Process  Coherency WDL  Content WDL  Delusions None reported or observed  Perception WDL  Hallucination None reported or observed  Judgment WDL  Confusion None  Danger to Self  Current suicidal ideation? Denies  Danger to Others  Danger to Others None reported or observed

## 2023-11-14 NOTE — Group Note (Signed)
 Date:  11/14/2023 Time:  10:09 AM  Group Topic/Focus:  Goals Group:   The focus of this group is to help patients establish daily goals to achieve during treatment and discuss how the patient can incorporate goal setting into their daily lives to aide in recovery. Orientation:   The focus of this group is to educate the patient on the purpose and policies of crisis stabilization and provide a format to answer questions about their admission.  The group details unit policies and expectations of patients while admitted.    Participation Level:  Did Not Attend  [name] 11/14/2023, 10:09 AM

## 2023-11-14 NOTE — Group Note (Signed)
 Date:  11/14/2023 Time:  9:11 PM  Group Topic/Focus:  Wrap-Up Group:   The focus of this group is to help patients review their daily goal of treatment and discuss progress on daily workbooks.    Participation Level:  Did Not Attend  Participation Quality:  Patient did not attend group  Affect:  Flat  Cognitive:  Lacking  Insight: None  Engagement in Group:  None  Modes of Intervention:  Discussion  Additional Comments:  patient did not attend group this evening   Bari Moats 11/14/2023, 9:11 PM

## 2023-11-14 NOTE — Plan of Care (Signed)
   Problem: Activity: Goal: Interest or engagement in activities will improve Outcome: Progressing Goal: Sleeping patterns will improve Outcome: Progressing

## 2023-11-14 NOTE — [facility] Suicide Risk Assessment (Signed)
 Suicide Risk Assessment ***date/time***  Discharge Assessment    [facility] Discharge Suicide Risk Assessment   Principal Problem: Bipolar disorder current episode depressed (HCC) Discharge Diagnoses: Principal Problem:   Bipolar 1 disorder, current episode depressed, severe (HCC) Active Problems:   Insomnia   GAD (generalized anxiety disorder)   Panic disorder   Nicotine  use disorder  QUARTEZ LAGOS is a 58 y.o. male  with a past psychiatric history of bipolar disorder, depression, anxiety. Patient initially arrived to [facility] on 11/09/2023 for reported suicide attempt, and admitted to [facility] under IVC on 11/10/2023 for acute safety concerns, crisis stabalization, acute suicidal or self-harming behaviors, impaired functioning, and stabilization of acute on chronic psychiatric conditions. PMHx is significant for HTN, insomnia, headaches.   On the day of discharge, ***   Musculoskeletal: Strength & Muscle Tone: within normal limits Gait & Station: normal Patient leans: N/A  Psychiatric Specialty Exam  Presentation  General Appearance:  Appropriate for Environment  Eye Contact: Good  Speech: Normal Rate  Speech Volume: Normal  Handedness:No data recorded  Mood and Affect  Mood: Euthymic  Duration of Depression Symptoms: No data recorded Affect: Appropriate; Congruent; Full Range   Thought Process  Thought Processes: Coherent; Linear  Descriptions of Associations:Intact  Orientation:Full (Time, Place and Person)  Thought Content:Logical  Hallucinations:Hallucinations: None  Ideas of Reference:None  Suicidal Thoughts:Suicidal Thoughts: No  Homicidal Thoughts:Homicidal Thoughts: No   Sensorium  Memory: Immediate Good; Recent Good; Remote Good  Judgment: Good  Insight: Good   Executive Functions  Concentration: Good  Attention Span: Good  Recall: Good  Fund of Knowledge: Good  Language: Good   Psychomotor Activity  Psychomotor  Activity:Psychomotor Activity: Normal   Assets  Assets: Desire for Improvement; Housing; Social Support   Sleep  Sleep:Sleep: Good  Estimated Sleeping Duration (Last 24 Hours): 7.00-7.25 hours (Due to Daylight Saving Time, the durations displayed may not accurately represent documentation during the time change interval)  Physical Exam: *** Physical Exam ROS Blood pressure (!) 147/98, pulse 98, temperature 97.8 F (36.6 C), temperature source Oral, resp. rate 16, height 5' 9 (1.753 m), weight 81 kg, SpO2 96%. Body mass index is 26.37 kg/m.  Mental Status Per Nursing Assessment::   On Admission:  Suicidal ideation indicated by others, Self-harm behaviors, Thoughts of violence towards others  Demographic Factors:  Male and Caucasian  Loss Factors: Financial problems/change in socioeconomic status  Historical Factors: Prior suicide attempts and Impulsivity  Risk Reduction Factors:   Sense of responsibility to family, Living with another person, especially a relative, Positive social support, Positive therapeutic relationship, and Positive coping skills or problem solving skills  Continued Clinical Symptoms:  Mood is stable. Anxiety at a manageable level. Denying any SI including passive SI.   Cognitive Features That Contribute To Risk:  None    Suicide Risk:  Mild:  There are no identifiable suicide plans, no associated intent, mild dysphoria and related symptoms, good self-control (both objective and subjective assessment), few other risk factors, and identifiable protective factors, including available and accessible social support.    Follow-up Information     [facility]. Call.   Why: PRIMARY CARE DOCTOR - [facility] 1 Contact information: [address] [place]  [phone]        [facility]. Call.   Why: PRIMARY CARE DOCTOR - OPT[facility] 2 Contact information: [address] [place]  [phone]        [facility]. Call.   Why: PRIMARY CARE DOCTOR - [facility] 3 Contact information: [address] [place]  [address] [phone]        [facility] Follow up on 11/17/2023.   Why: Please call your provider on 11/17/23 at 9:00 am to schedule appointments for therapy and medication management services. Contact information: [address], [place], [place] [address] Phone: [phone] Fax:  [phone]        [facility]. Schedule an appointment as soon as possible for a visit.   Why: Please call this provider to schedule an appointment for primary care services, as soon as possible. Contact information: [address] [place]  [phone]                Plan Of Care/Follow-up recommendations:  Activity: as tolerated  Diet: heart healthy  Other: -Follow-up with your outpatient psychiatric provider -instructions on appointment date, time, and address (location) are provided to you in discharge paperwork.  -Take your psychiatric medications as prescribed at discharge - instructions are provided to you in the discharge paperwork  -Follow-up with outpatient primary care doctor and other specialists -for management of chronic medical disease, including: hypertension  -Testing: Follow-up with outpatient provider for abnormal lab results: none  -Recommend abstinence from alcohol, tobacco, and other illicit drug use at discharge.   -If your psychiatric symptoms recur, worsen, or if you have side effects to your psychiatric medications, call your outpatient psychiatric provider, [phone] or go to the nearest emergency department.  -If suicidal thoughts recur, call your outpatient psychiatric provider, [phone], [phone] or go to the nearest emergency department.   Justino Cornish,  MD 11/14/2023, 12:47 PM

## 2023-11-14 NOTE — Group Note (Signed)
 Date:  11/14/2023 Time:  10:27 AM  Group Topic/Focus: Emotional Wellness Emotional Education:   The focus of this group is to discuss what feelings/emotions are, and how they are experienced. To improve communication skills by teaching participants how to express their feelings and needs without blaming others, which helps reduce conflict and defensiveness. By focusing on the speaker's personal experience, participants learn to express themselves more effectively and empathetically, leading to better understanding and more productive conversations.     Participation Level:  Active  Participation Quality:  Appropriate  Affect:  Appropriate  Cognitive:  Appropriate  Insight: Appropriate  Engagement in Group:  Engaged  Modes of Intervention:  Discussion and Education  Additional Comments:  [name] was in and out of group. When [name] was in group, he was attentive and actively participating in discussion.   [name] [name] 11/14/2023, 10:27 AM

## 2023-11-14 NOTE — Progress Notes (Signed)
(  Sleep Hours) -7.25 (Any PRNs that were needed, meds refused, or side effects to meds)- prn klonopin @ 1955, advil @ 2111, cepacol @ 0021 (Any disturbances and when (visitation, over night)-none (Concerns raised by the patient)- none (SI/HI/AVH)- denies all

## 2023-11-14 NOTE — Discharge Summary (Signed)
 Physician Discharge Summary Note 11/15/23  Patient:  [name] is an 58 y.o., male MRN:  [id number] DOB:  1965/06/04 Patient phone:  [phone] (home)  Patient address:   [address] [place] [address],  Total Time spent with patient: 30 minutes  Date of Admission:  11/10/2023 Date of Discharge: 11/15/2023  [name] is a 58 y.o. male  with a past psychiatric history of bipolar disorder, depression, anxiety. Patient initially arrived to [facility] on 11/09/2023 for reported suicide attempt, and admitted to [facility] under IVC on 11/10/2023 for acute safety concerns, crisis stabalization, acute suicidal or self-harming behaviors, impaired functioning, and stabilization of acute on chronic psychiatric conditions. PMHx is significant for HTN, insomnia, headaches.   Principal Problem: Bipolar disorder current episode depressed (HCC) Discharge Diagnoses: Principal Problem:   Bipolar 1 disorder, current episode depressed, severe (HCC) Active Problems:   Insomnia   GAD (generalized anxiety disorder)   Panic disorder   Nicotine  use disorder   Past Psychiatric Hx: Current Psychiatrist: Rosslyn Prost, [facility] Current Therapist: none Previous Psychiatric Diagnoses: Bipolar I, GAD  Psychiatric Medications: Current Olanzapine  5 mg once day Hydroxyzine  50 mg TID PRN Divalproex  ER 500 mg three times daily Clonazepam 0.5 mg TID PRN Past Quetiapine  50 mg  Eszopiclone 2 mg Brexpiprazole 1 mg Aripiprazole 2 mg Psychiatric Hospitalization hx: hospitalized 1 year ago for manic episode Psychotherapy hx: no history of therapy ACT team hx: none Neuromodulation history: none  History of suicide: none  History of homicide or aggression: none   Substance Use Hx: Alcohol: None Nicotine : 24 mg daily via nicotine  vape, not interested in cutting back Cannabis: None Other Substances: denies Non-prescribed medications: none Rehab  History: none   Past Medical History: PCP: none Medical Dx: HTN, headaches Meds: losartan -hydrochlorothiazide 100-25 mg previously but not currently taking Allergies: Sulfa, penicillins Hospitalizations: did not discuss Surgeries: did not discuss TBI: none Seizures: none   Family History: family history includes Bipolar disorder in his sister; Cancer in his mother; Diabetes in his sister; Other in his father.  Psychiatric Dx: Bipolar disorder in sister Suicide Hx: none Violence/Aggression: dis not discuss Substance use: did not discuss   Social History: Developmental: Father died when pt was 9. Raised by mother with cancer, who was frequently bedridden. Raised by a nanny b/c family was well off. No other concerns Current Living Situation: Lives with girlfriend and son Education: Completed 1 year of college Occupation: Currently unemployed Hobbies: Previously played basketball, worked out at gym. Currently no hobbies Spirituality/religiosity: did not discuss Marital Status / Relationships: has girlfriend Children: 1 son Military: no   Legal: denies any upcoming court dates.  Access to firearms: none per patient    Hospital course: Prior to admission patient had drink concentrated nicotine  juice after his vape broke as an attempt to get high but was interpreted as girlfriend as a suicide attempt.  Despite numerous attempts we were unable to get a hold of girlfriend.  Patient persistently said that it was not a suicide attempt but was okay to stay in the hospital to have changes to his mood stabilizer regimen as he did not report benefit from olanzapine  and Depakote .  Patient was transition to Seroquel  given his significant anxiety, insomnia, and to help with depression which is his predominant issue as he has only had 1 manic episode 1 year ago during which time he was diagnosed with bipolar 1 disorder.  Patient tolerated 150 okay was titrated 300  during which time he reported  benefit from his sleep and only mild sedation the morning time.  Patient was having persisting anxiety during the hospitalization and was continued on his home clonazepam.  Discussed with patient trying low-dose Seroquel  for anxiety during the day but he declined.  Throughout hospitalization patient was euthymic appearing and denied any suicidal thoughts.  Patient is hopeful at discharge to get a job.   On the day of discharge, patient reports that he is sleeping well, eating well. Staff report no overnight issues or concerns with patient's behavior. Patient reports good effect from his stay. Conducted brief psychoeducation and motivational interviewing about patient's nicotine  use and how it contributes to multiple problems across systems. Patient reports that he is looking forward to getting home and start applying for jobs. Patient reports no new side effects that he attributes to medications. He denies SI/HI/AVH.   During the patient's hospitalization, patient had extensive initial psychiatric evaluation, and follow-up psychiatric evaluations every day.  Psychiatric diagnoses provided upon initial assessment:  Principal Problem:   Bipolar 1 disorder, current episode depressed, severe (HCC) Active Problems:   Insomnia   GAD (generalized anxiety disorder)   Panic disorder   Nicotine  use disorder   Patient's psychiatric medications were adjusted on admission:  -- Start Seroquel  ER 150 mg once daily at bedtime for bipolar disorder, GAD, insomnia             -- Continue clonazepam 0.5 mg 3 times daily as needed for anxiety             -- Continue hydroxyzine  50 mg 3 times daily as needed for anxiety             -- Continue trazodone  50 mg once nightly as needed for insomnia             -- discontinue olanzapine  in favor of starting seroquel  and given lack of efficacy for depression             -- Discontinue depakote  due to lack of eficacy and no acute manic symptoms  During the  hospitalization, other adjustments were made to the patient's psychiatric medication regimen:   - Titrated Seroquel  ER to 300 mg once at bedtime for mood stabilization, GAD, insomnia  -- Started amlodipine 10 mg once daily for hypertension  -- Started losartan  50 mg once daily for hypertension  Patient's care was discussed during the interdisciplinary team meeting every day during the hospitalization.  The patient is not having side effects to prescribed psychiatric medication.  Gradually, patient started adjusting to milieu. The patient was evaluated each day by a clinical provider to ascertain response to treatment. Improvement was noted by the patient's report of decreasing symptoms, improved sleep and appetite, affect, medication tolerance, behavior, and participation in unit programming.  Patient was asked each day to complete a self inventory noting mood, mental status, pain, new symptoms, anxiety and concerns.   Symptoms were reported as significantly decreased or resolved completely by discharge.  The patient reports that their mood is stable.  The patient denied having suicidal thoughts for more than 48 hours prior to discharge.  Patient denies having homicidal thoughts.  Patient denies having auditory hallucinations.  Patient denies any visual hallucinations or other symptoms of psychosis.  The patient was motivated to continue taking medication with a goal of continued improvement in mental health.   The patient reports their target psychiatric symptoms of depression and insomnia responded well to the psychiatric medications, and  the patient reports overall benefit other psychiatric hospitalization. Supportive psychotherapy was provided to the patient. The patient also participated in regular group therapy while hospitalized. Coping skills, problem solving as well as relaxation therapies were also part of the unit programming.  Labs were reviewed with the patient, and abnormal results  were discussed with the patient.  The patient is able to verbalize their individual safety plan to this provider.  # It is recommended to the patient to continue psychiatric medications as prescribed, after discharge from the hospital.    # It is recommended to the patient to follow up with your outpatient psychiatric provider and PCP.  # It was discussed with the patient, the impact of alcohol, drugs, tobacco have been there overall psychiatric and medical wellbeing, and total abstinence from substance use was recommended the patient.ed.  # Prescriptions provided or sent directly to preferred pharmacy at discharge. Patient agreeable to plan. Given opportunity to ask questions. Appears to feel comfortable with discharge.    # In the event of worsening symptoms, the patient is instructed to call the crisis hotline, 911 and or go to the nearest ED for appropriate evaluation and treatment of symptoms. To follow-up with primary care provider for other medical issues, concerns and or health care needs  # Patient was discharged home with a plan to follow up as noted below.  On the day of discharge, patient reports that he is sleeping well, eating well. Staff report no overnight issues or concerns with patient's behavior. Patient reports good effect from his stay. Conducted brief psychoeducation and motivational interviewing about patient's nicotine  use and how it contributes to multiple problems across systems. Patient reports that he is looking forward to getting home and start applying for jobs. Patient reports no new side effects that he attributes to medications. He denies SI/HI/AVH.    Physical Findings: AIMS: Facial and Oral Movements Muscles of Facial Expression: None Lips and Perioral Area: None Jaw: None Tongue: None,Extremity Movements Upper (arms, wrists, hands, fingers): None Lower (legs, knees, ankles, toes): None, Trunk Movements Neck, shoulders, hips: None, Global  Judgements Severity of abnormal movements overall : None Incapacitation due to abnormal movements: None Patient's awareness of abnormal movements: No Awareness, Dental Status Current problems with teeth and/or dentures?: No Does patient usually wear dentures?: No Edentia?: No, Other Do movements disappear in sleep?: No, AIMS Total Score AIMS Total Score: 0 CIWA:  0  Musculoskeletal: Strength & Muscle Tone: within normal limits Gait & Station: normal Patient leans: N/A   Psychiatric Specialty Exam: Musculoskeletal: Strength & Muscle Tone: within normal limits Gait & Station: normal Patient leans: N/A   Psychiatric Specialty Exam Mental Status Exam: General Appearance and Behavior: Fairly Groomed,   Orientation:  Full (Time, Place, and Person)  Memory:  Grossly intact  Attention: Fair  Eye Contact:  Good  Speech:  Clear and Coherent and Normal Rate  Language:  Good  Volume:  Normal  Mood: I feel okay, I'm glad to be going home though  Affect:  Appropriate, Congruent, and anxious  Thought Process:  Coherent and Linear  Thought Content:  WDL  Suicidal Thoughts:  No  Homicidal Thoughts:  No  Judgement:  Fair  Insight:  Fair  Psychomotor Activity:  Normal  Akathisia:  No  Fund of Knowledge:  Fair Assets:  Manufacturing Systems Engineer Desire for Improvement Housing Intimacy Resilience Social Support  Cognition:  WNL  ADL's:  Intact    Details about paranoia, delusions, or hallucinations: Sleep  Sleep:Sleep: Good  Estimated Sleeping Duration (Last 24 Hours): 6.25-7.50 hours (Due to Daylight Saving Time, the durations displayed may not accurately represent documentation during the time change interval)   Physical Exam:  Vitals and nursing note reviewed.  Constitutional:      General: He is not in acute distress.    Appearance: Normal appearance. He is not ill-appearing.  HENT:     Head: Normocephalic and atraumatic.  Pulmonary:     Effort: Pulmonary effort is  normal.  Skin:    General: Skin is warm and dry.  Neurological:     General: No focal deficit present.     Mental Status: He is alert and oriented to person, place, and time.     Sensory: No sensory deficit.  Psychiatric:        Attention and Perception: Attention and perception normal.        Mood and Affect: Affect normal. Mood is anxious.        Speech: Speech normal.        Behavior: Behavior normal. Behavior is cooperative.        Thought Content: Thought content is not paranoid or delusional. Thought content does not include homicidal or suicidal ideation.        Cognition and Memory: Cognition and memory normal.        Judgment: Judgment normal.     Review of Systems: Constitutional:  Negative for chills, diaphoresis, fever, malaise/fatigue and weight loss.  Respiratory:  Negative for cough.   Cardiovascular:  Negative for chest pain.  Gastrointestinal:  Negative for abdominal pain, constipation, diarrhea, nausea and vomiting.  Genitourinary:  Negative for urgency.  Psychiatric/Behavioral:  Positive for substance abuse. Negative for depression, hallucinations and suicidal ideas. The patient is nervous/anxious. The patient does not have insomnia.    Blood pressure (!) 149/100, pulse 95, temperature (!) 97.5 F (36.4 C), temperature source Oral, resp. rate 20, height 5' 9 (1.753 m), weight 81 kg, SpO2 96%. Body mass index is 26.37 kg/m.     Social History   Tobacco Use  Smoking Status Never  Smokeless Tobacco Current   Types: Chew   Tobacco Cessation:  A prescription for an FDA-approved tobacco cessation medication provided at discharge   Blood Alcohol level:  Lab Results  Component Value Date   [facility] <15 11/09/2023   ETH <10 05/09/2022    Metabolic Disorder Labs:  Lab Results  Component Value Date   HGBA1C 5.0 11/12/2023   MPG 96.8 11/12/2023   MPG 108.28 05/09/2022   Lab Results  Component Value Date   PROLACTIN 9.9 05/09/2022   Lab Results  Component  Value Date   CHOL 199 11/12/2023   TRIG 129 11/12/2023   HDL 48 11/12/2023   CHOLHDL 4.1 11/12/2023   VLDL 26 11/12/2023   LDLCALC 125 (H) 11/12/2023   LDLCALC 113 (H) 05/09/2022    See Psychiatric Specialty Exam and Suicide Risk Assessment completed by Attending Physician prior to discharge.  Discharge destination:  Home  Is patient on multiple antipsychotic therapies at discharge:  No      Allergies as of 11/15/2023       Reactions   Benadryl [diphenhydramine] Other (See Comments)   Worsens depression   Doxycycline Other (See Comments)   Made him feel horrible   Sulfa Antibiotics Other (See Comments)   Unknown    Penicillins Rash        Medication List     STOP taking these medications    divalproex  500 MG  24 hr tablet Commonly known as: DEPAKOTE  ER   DULoxetine  30 MG capsule Commonly known as: CYMBALTA    hydrOXYzine  50 MG tablet Commonly known as: ATARAX    losartan -hydrochlorothiazide 100-12.5 MG tablet Commonly known as: HYZAAR   OLANZapine  5 MG tablet Commonly known as: ZYPREXA    UNABLE TO FIND       TAKE these medications      Indication  amLODipine 10 MG tablet Commonly known as: NORVASC Take 1 tablet (10 mg total) by mouth daily.  Indication: High Blood Pressure   clonazePAM 0.5 MG tablet Commonly known as: KLONOPIN Take 0.5 mg by mouth in the morning, at noon, and at bedtime.  Indication: Anxiety Disorder   gabapentin 300 MG capsule Commonly known as: NEURONTIN Take 1 capsule (300 mg total) by mouth 3 (three) times daily.  Indication: Generalized Anxiety Disorder   losartan  50 MG tablet Commonly known as: COZAAR  Take 1 tablet (50 mg total) by mouth daily.  Indication: High Blood Pressure   nicotine  21 mg/24hr patch Commonly known as: NICODERM CQ  - dosed in mg/24 hours Place 1 patch (21 mg total) onto the skin daily.  Indication: Nicotine  Addiction   nicotine  polacrilex 4 MG gum Commonly known as: NICORETTE Take 1 each (4  mg total) by mouth as needed for smoking cessation.  Indication: Nicotine  Addiction   QUEtiapine  300 MG 24 hr tablet Commonly known as: SEROQUEL  XR Take 1 tablet (300 mg total) by mouth at bedtime.  Indication: Manic-Depression, Generalized Anxiety Disorder, Trouble Sleeping   Vitamin D (Ergocalciferol) 1.25 MG (50000 UNIT) Caps capsule Commonly known as: DRISDOL Take 1 capsule (50,000 Units total) by mouth every 7 (seven) days. Start taking on: November 20, 2023  Indication: Vitamin D Deficiency        Follow-up Information     [facility]. Call.   Why: PRIMARY CARE DOCTOR - OPTION 1 Contact information: [address] [place]  [phone]        [facility]. Call.   Why: PRIMARY CARE DOCTOR - OPTION 2 Contact information: [address] [place]  [phone]         [facility]. Call.   Why: PRIMARY CARE DOCTOR - OPTION 3 Contact information: [address] [place]  [address] [phone]        [facility] Follow up on 11/17/2023.   Why: Please call your provider on 11/17/23 at 9:00 am to schedule appointments for therapy and medication management services. Contact information: [address], [place], [place] [address] Phone: [phone] Fax:  [phone]        [facility]. Schedule an appointment as soon as possible for a visit.   Why: Please call this provider to schedule an appointment for primary care services, as soon as possible. Contact information: [address] [place]  [address]                Follow-up recommendations:   Activity: as tolerated   Diet: heart healthy   Other: -Follow-up with your outpatient psychiatric provider -instructions on appointment date, time, and address (location)  are provided to you in discharge paperwork.   -Take your psychiatric medications as prescribed at discharge - instructions are provided to you in the discharge paperwork   -Follow-up with outpatient primary care doctor and other specialists -for management of chronic medical disease, including: hypertension   -  Testing: Follow-up with outpatient provider for abnormal lab results: vitamin D deficiency   -Recommend abstinence from alcohol, tobacco, and other illicit drug use at discharge.    -If your psychiatric symptoms recur, worsen, or if you have side effects to your psychiatric medications, call your outpatient psychiatric provider, 911, [phone] or go to the nearest emergency department.   -If suicidal thoughts recur, call your outpatient psychiatric provider, 911, [phone] or go to the nearest emergency department.  Signed: Lynwood Morene Lavone Delsie, MD 11/15/2023, 4:47 PM

## 2023-11-14 NOTE — [facility] Suicide Risk Assessment (Signed)
 [facility] INPATIENT:  Family/Significant Other Suicide Prevention Education  Suicide Prevention Education:  Education Completed; Sari True,  (name of family member/significant other) has been identified by the patient as the family member/significant other with whom the patient will be residing, and identified as the person(s) who will aid the patient in the event of a mental health crisis (suicidal ideations/suicide attempt).  With written consent from the patient, the family member/significant other has been provided the following suicide prevention education, prior to the and/or following the discharge of the patient.  The suicide prevention education provided includes the following: Suicide risk factors Suicide prevention and interventions National Suicide Hotline telephone number [facility] assessment telephone number [facility] Emergency Assistance 911 [facility] and/or Residential Mobile Crisis Unit telephone number  Request made of family/significant other to: Remove weapons (e.g., guns, rifles, knives), all items previously/currently identified as safety concern.   Remove drugs/medications (over-the-counter, prescriptions, illicit drugs), all items previously/currently identified as a safety concern.  The family member/significant other verbalizes understanding of the suicide prevention education information provided.  The family member/significant other agrees to remove the items of safety concern listed above.  Ermalinda Penton [place] 11/14/2023, 2:41 PM

## 2023-11-14 NOTE — Plan of Care (Signed)
   Problem: Education: Goal: Knowledge of [place] [facility] Education information/materials will improve Outcome: Progressing Goal: Emotional status will improve Outcome: Progressing Goal: Mental status will improve Outcome: Progressing

## 2023-11-14 NOTE — [facility] Group Notes (Signed)
 Adult Psychoeducational Group Note  Date:  11/14/2023 Time:  4:57 PM  Group Topic/Focus:  Overcoming Stress:   The focus of this group is to define stress and help patients assess their triggers.  Participation Level:  Active  Participation Quality:  Appropriate and Attentive  Affect:  Appropriate  Cognitive:  Alert and Appropriate  Insight: Appropriate and Good  Engagement in Group:  Engaged  Modes of Intervention:  Discussion  Additional Comments:  na  [name] Doing 11/14/2023, 4:57 PM

## 2023-11-14 NOTE — Progress Notes (Signed)
 [facility] MD Progress Note  11/14/2023 12:35 PM CON ARGANBRIGHT  MRN:  [id number]  Principal Problem: Bipolar disorder current episode depressed (HCC) Diagnosis: Principal Problem:   Bipolar 1 disorder, current episode depressed, severe (HCC) Active Problems:   Insomnia   GAD (generalized anxiety disorder)   Panic disorder   Nicotine  use disorder   Reason for Admission:  [name] is a 58 y.o. male  with a past psychiatric history of bipolar disorder, depression, anxiety. Patient initially arrived to [facility] on 11/09/2023 for reported suicide attempt, and admitted to [facility] under IVC on 11/10/2023 for acute safety concerns, crisis stabalization, acute suicidal or self-harming behaviors, impaired functioning, and stabilization of acute on chronic psychiatric conditions. PMHx is significant for HTN, insomnia, headaches.  (admitted on 11/10/2023, total  LOS: 4 days )   last 24 hours:  148/107 1200, 130/98 1600, 147/98 0600, other VSS. Taking scheduled meds. Clonidine and ibuprofen yesterday PRN. This AM got mild agitation PRN 0639.   Information Obtained Today During Patient Interview:  Reports that he is continue to feel better.  Denies any thoughts to harm self.  Continues to report that the nicotine  juice was to get high and not because he was suicidal.  Reports that the Seroquel  has been helpful for his sleep and denies any significant side effects to it except for some mild morning sedation but that he thinks the benefits outweigh the risk.  Reports that anxiety is still severe but he does not want to try low-dose Seroquel  during the day at this time.  Discussed increasing the gabapentin further for his anxiety and he was agreeable to this.  He denies any significant side effects to the gabapentin so far.  Reports that he has been having a sore throat.  Asking leave tomorrow.  Reports that he talked to his girlfriend yesterday and they are on good terms.  He reports he understands why she was concerned  about his safety on admission but still does not think he was experiencing suicidal thoughts.     Past Psychiatric Hx: Current Psychiatrist: Rosslyn Prost, [facility] Current Therapist: none Previous Psychiatric Diagnoses: Bipolar I, GAD  Psychiatric Medications: Current Olanzapine  5 mg once day Hydroxyzine  50 mg TID PRN Divalproex  ER 500 mg three times daily Clonazepam 0.5 mg TID PRN Past Quetiapine  50 mg  Eszopiclone 2 mg Brexpiprazole 1 mg Aripiprazole 2 mg Psychiatric Hospitalization hx: hospitalized 1 year ago for manic episode Psychotherapy hx: no history of therapy ACT team hx: none Neuromodulation history: none  History of suicide: none  History of homicide or aggression: none   Substance Use Hx: Alcohol: None Nicotine : 24 mg daily via nicotine  vape, not interested in cutting back Cannabis: None Other Substances: denies Non-prescribed medications: none Rehab History: none   Past Medical History: PCP: none Medical Dx: HTN, headaches Meds: losartan -hydrochlorothiazide 100-25 mg previously but not currently taking Allergies: Sulfa, penicillins Hospitalizations: did not discuss Surgeries: did not discuss TBI: none Seizures: none   Family History: family history includes Bipolar disorder in his sister; Cancer in his mother; Diabetes in his sister; Other in his father.  Psychiatric Dx: Bipolar disorder in sister Suicide Hx: none Violence/Aggression: dis not discuss Substance use: did not discuss   Social History: Developmental: Father died when pt was 9. Raised by mother with cancer, who was frequently bedridden. Raised by a nanny b/c family was well off. No other concerns Current Living Situation: Lives with girlfriend and son Education: Completed 1 year  of college Occupation: Currently unemployed Hobbies: Previously played basketball, worked out at gym. Currently no hobbies Spirituality/religiosity: did not  discuss Marital Status / Relationships: has girlfriend Children: 1 son Military: no   Legal: denies any upcoming court dates.  Access to firearms: none per patient      Current Medications: Current Facility-Administered Medications  Medication Dose Route Frequency Provider Last Rate Last Admin   acetaminophen  (TYLENOL ) tablet 1,000 mg  1,000 mg Oral Q6H PRN Arlington Sigmund, MD   1,000 mg at 11/14/23 1022   alum & mag hydroxide-simeth (MAALOX/MYLANTA) 200-200-20 MG/5ML suspension 30 mL  30 mL Oral Q4H PRN Sophie Tamez, MD       amLODipine (NORVASC) tablet 10 mg  10 mg Oral Daily Bobak Oguinn, MD   10 mg at 11/14/23 0815   clonazePAM (KLONOPIN) tablet 0.5 mg  0.5 mg Oral TID PRN Butler, Laura N, MD   0.5 mg at 11/14/23 0640   cloNIDine (CATAPRES) tablet 0.1 mg  0.1 mg Oral BID PRN Butler, Laura N, MD   0.1 mg at 11/13/23 1337   gabapentin (NEURONTIN) capsule 300 mg  300 mg Oral TID Bennetta Rudden, MD       ibuprofen (ADVIL) tablet 800 mg  800 mg Oral Q8H PRN Tricia Pledger, MD   800 mg at 11/13/23 2111   losartan  (COZAAR ) tablet 50 mg  50 mg Oral Daily Annie Roseboom, MD   50 mg at 11/14/23 0815   menthol (CEPACOL) lozenge 3 mg  1 lozenge Oral TID PRN Bobbitt, Shalon E, NP   3 mg at 11/14/23 0021   multivitamin with minerals tablet 1 tablet  1 tablet Oral Daily Onuoha, Chinwendu V, NP   1 tablet at 11/13/23 9176   nicotine  (NICODERM CQ  - dosed in mg/24 hours) patch 21 mg  21 mg Transdermal Daily McLauchlin, Angela, NP   21 mg at 11/14/23 9156   nicotine  polacrilex (NICORETTE) gum 4 mg  4 mg Oral PRN Elaysia Devargas, MD   4 mg at 11/14/23 1005   OLANZapine  (ZYPREXA ) injection 10 mg  10 mg Intramuscular TID PRN Genecis Veley, MD       OLANZapine  (ZYPREXA ) injection 5 mg  5 mg Intramuscular TID PRN Rainey Rodger, MD       OLANZapine  zydis (ZYPREXA ) disintegrating tablet 5 mg  5 mg Oral TID PRN Prisilla Kocsis, MD   5 mg at 11/14/23 9360   QUEtiapine  (SEROQUEL  XR) 24 hr tablet 300 mg  300  mg Oral QHS Jewelianna Pancoast, MD   300 mg at 11/13/23 2111   thiamine (Vitamin B-1) tablet 100 mg  100 mg Oral Daily Onuoha, Chinwendu V, NP   100 mg at 11/14/23 0815   Vitamin D (Ergocalciferol) (DRISDOL) 1.25 MG (50000 UNIT) capsule 50,000 Units  50,000 Units Oral Q7 days Livvy Spilman, MD   50,000 Units at 11/13/23 9176   white petrolatum (VASELINE) gel   Topical PRN Cornelius Dines, MD   1 Application at 11/12/23 1318    Lab Results:  No results found for this or any previous visit (from the past 48 hours).   Blood Alcohol level:  Lab Results  Component Value Date   [facility] <15 11/09/2023   ETH <10 05/09/2022    Metabolic Labs: Lab Results  Component Value Date   HGBA1C 5.0 11/12/2023   MPG 96.8 11/12/2023   MPG 108.28 05/09/2022   Lab Results  Component Value Date   PROLACTIN 9.9 05/09/2022   Lab Results  Component Value  Date   CHOL 199 11/12/2023   TRIG 129 11/12/2023   HDL 48 11/12/2023   CHOLHDL 4.1 11/12/2023   VLDL 26 11/12/2023   LDLCALC 125 (H) 11/12/2023   LDLCALC 113 (H) 05/09/2022    Physical Findings: AIMS: No   Psychiatric Specialty Exam:  Presentation  General Appearance: Wearing paper scrubs, Caucasian male, appears stated age, fair hygiene Eye Contact:Good  Speech:Normal Rate  Speech Volume:Normal   Mood and Affect  Mood:Anxious; Depressed  Affect:-- (very anxious, congreunt, full range)   Thought Process  Thought Processes:Linear; Coherent  Descriptions of Associations:Intact  Orientation:Full (Time, Place and Person)  Thought Content:Logical   Hallucinations: Denies  Ideas of Reference:None  Suicidal Thoughts: Denies  Homicidal Thoughts: Denie denies Sensorium  Memory:Immediate Good; Recent Good; Remote Good  Judgment:Poor  Insight:Fair   Executive Functions  Concentration:Good  Attention Span:Good  Recall:Good  Fund of Knowledge:Good  Language:Good   Psychomotor Activity  Psychomotor Activity:  Normal   Assets  Assets:Desire for Improvement; Housing; Social Support   Sleep  Sleep: Good with Seroquel     Physical Exam: Physical Exam Vitals and nursing note reviewed.  HENT:     Head: Normocephalic and atraumatic.  Pulmonary:     Effort: Pulmonary effort is normal.  Neurological:     General: No focal deficit present.     Mental Status: He is alert.  Psychiatric:     Comments: No obvious EPS.    Review of Systems  Constitutional:  Negative for diaphoresis and fever.  Cardiovascular:  Negative for chest pain and palpitations.  Gastrointestinal:  Negative for constipation, diarrhea, nausea and vomiting.  Neurological:  Negative for dizziness, tremors, weakness and headaches.   Blood pressure (!) 147/98, pulse 98, temperature 97.8 F (36.6 C), temperature source Oral, resp. rate 16, height 5' 9 (1.753 m), weight 81 kg, SpO2 96%. Body mass index is 26.37 kg/m.   ASSESSMENT & PLAN   ASSESSMENT:   Diagnoses / Active Problems:  Principal Problem:   Bipolar 1 disorder, current episode depressed, severe (HCC) Active Problems:   Insomnia   GAD (generalized anxiety disorder)   Panic disorder   Nicotine  use disorder     Kajuan is a 58 year old male with history of bipolar disorder, GAD, and hospitalization 1 year ago for manic episode. Pt was brought in for attempted suicide by ingesting liquid nicotine  as reported by his girlfriend. He reports never having had thoughts of suicide or self harm. He reports wanting nicotine  intake after his e-cigarette broke, so he took a sip of the concentrated liquid nicotine , which made him ill. He lost his job a few months ago and has been unable to fill prescriptions for his psychiatric medications for 2-3 weeks.   11/14/23 Plan to discharge tomorrow.  Intentional overdose prior to admission still appears to be more impulsive and nicotine  seeking than suicide attempt.  Transition to Seroquel  monotherapy as mood stabilizer was good  decision as patient is tolerating well and has had benefit for sleep.  He is still having persistent severe anxiety during the day but it was not open to try low-dose Seroquel  today and instead would prefer to use hydroxyzine  and clonazepam.    PLAN: Safety and Monitoring:             -- Involuntary admission (until 11/3) to inpatient psychiatric unit for safety, stabilization and treatment             -- Daily contact with patient to assess and evaluate symptoms  and progress in treatment             -- Patient's case to be discussed in multi-disciplinary team meeting             -- Observation Level : q15 minute checks             -- Vital signs:  q12 hours             -- Precautions: suicide, elopement, and assault   2. Psychiatric Treatment:  -- Continue Seroquel  ER 300 mg mg once daily at bedtime for bipolar disorder, GAD, insomnia -- Increase gabapentin from 200 to 300 mg 3 times daily for GAD             -- Continue clonazepam 0.5 mg 3 times daily as needed for anxiety             -- Continue hydroxyzine  50 mg 3 times daily as needed for anxiety   --  The risks/benefits/side-effects/alternatives to this medication were discussed in detail with the patient and time was given for questions. The patient consents to medication trial.              -- Metabolic profile and EKG monitoring obtained while on an atypical antipsychotic. See #4 below for values.              -- Encouraged patient to participate in unit milieu and in scheduled group therapies              -- Short Term Goals: Ability to identify changes in lifestyle to reduce recurrence of condition will improve, Ability to verbalize feelings will improve, Ability to disclose and discuss suicidal ideas, Ability to demonstrate self-control will improve, Ability to identify and develop effective coping behaviors will improve, Ability to maintain clinical measurements within normal limits will improve, Compliance with prescribed  medications will improve, and Ability to identify triggers associated with substance abuse/mental health issues will improve             -- Long Term Goals: Improvement in symptoms so as ready for discharge                3. Medical Issues Being Addressed:              -- Increase amlodipine from 5 to 10 mg once daily for hypertension  -- Start losartan  50 mg once daily for hypertension  -- Continue vitamin D 50,000 units once daily for vitamin D deficiency                 -- Continue PRN's: Tylenol , Maalox, Milk of Magnesia     4. Routine and other pertinent labs reviewed: EKG monitoring: QTc: 452  Metabolism / endocrine: BMI: Body mass index is 26.37 kg/m. Prolactin: Lab Results  Component Value Date   PROLACTIN 9.9 05/09/2022   Lipid Panel: Lab Results  Component Value Date   CHOL 199 11/12/2023   TRIG 129 11/12/2023   HDL 48 11/12/2023   CHOLHDL 4.1 11/12/2023   VLDL 26 11/12/2023   LDLCALC 125 (H) 11/12/2023   LDLCALC 113 (H) 05/09/2022   HbgA1c: Hgb A1c MFr Bld (%)  Date Value  11/12/2023 5.0   TSH: TSH (uIU/mL)  Date Value  11/12/2023 0.939  02/19/2017 1.010    Labs to order: n/a  5. Discharge Planning:              -- Social work and case management to assist with discharge  planning and identification of hospital follow-up needs prior to discharge             -- Estimated LOS: 11/3             -- Discharge Concerns: Need to establish a safety plan; Medication compliance and effectiveness             -- Discharge Goals: Return home with outpatient referrals for mental health follow-up including medication management/psychotherapy    I certify that inpatient services furnished can reasonably be expected to improve the patient's condition.     Justino Cornish, MD PGY-2 Psychiatry Resident 11/14/2023, 12:35 PM

## 2023-11-15 DIAGNOSIS — Z720 Tobacco use: Secondary | ICD-10-CM

## 2023-11-15 DIAGNOSIS — F314 Bipolar disorder, current episode depressed, severe, without psychotic features: Secondary | ICD-10-CM

## 2023-11-15 DIAGNOSIS — G4700 Insomnia, unspecified: Secondary | ICD-10-CM

## 2023-11-15 DIAGNOSIS — F410 Panic disorder [episodic paroxysmal anxiety] without agoraphobia: Secondary | ICD-10-CM

## 2023-11-15 DIAGNOSIS — F411 Generalized anxiety disorder: Secondary | ICD-10-CM

## 2023-11-15 NOTE — Transportation (Signed)
 11/15/2023  [name] DOB: [birth year] MRN: [id number]   RIDER WAIVER AND RELEASE OF LIABILITY  For the purposes of helping with transportation needs, [facility] partners with outside transportation providers (taxi companies, [place], catering manager.) to give [facility] patients or other approved people the choice of on-demand rides Public Librarian) to our buildings for non-emergency visits.  By using [employer], I, the person signing this document, on behalf of myself and/or any legal minors (in my care using the [employer]), agree:  Science Writer given to me are supplied by independent, outside transportation providers who do not work for, or have any affiliation with, [employer]. [place] is not a transportation company. [facility] has no control over the quality or safety of the rides I get using [employer]. [facility] has no control over whether any outside ride will happen on time or not. [facility] gives no guarantee on the reliability, quality, safety, or availability on any rides, or that no mistakes will happen. I know and accept that traveling by vehicle (car, truck, SVU, fleeta, bus, taxi, etc.) has risks of serious injuries such as disability, being paralyzed, and death. I know and agree the risk of using [employer] is mine alone, and not [employer]. [employer] are provided as is and as are available. The transportation providers are in charge for all inspections and care of the vehicles used to provide these rides. I agree not to take legal action against [facility], its agents, employees, officers, directors, representatives, insurers, attorneys, assigns, successors, subsidiaries, and affiliates at any time for any reasons related directly or indirectly to using [employer]. I also agree not to take legal action against [facility] or its affiliates for any injury, death, or damage to property caused by or related to using  [employer]. I have read this Waiver and Release of Liability, and I understand the terms used in it and their legal meaning. This Waiver is freely and voluntarily given with the understanding that my right (or any legal minors) to legal action against [facility] relating to [employer] is knowingly given up to use these services.   I attest that I read the Ride Waiver and Release of Liability to [name], gave Mr. Auld the opportunity to ask questions and answered the questions asked (if any). I affirm that [name] then provided consent for assistance with transportation.

## 2023-11-15 NOTE — Progress Notes (Signed)
(  Sleep Hours) - (Any PRNs that were needed, meds refused, or side effects to meds)- prn klonopin @ 2134 (Any disturbances and when (visitation, over night)-none (Concerns raised by the patient)- none (SI/HI/AVH)- denies all

## 2023-11-15 NOTE — Group Note (Signed)
 Date:  11/15/2023 Time:  9:41 AM  Group Topic/Focus:  Goals Group:   The focus of this group is to help patients establish daily goals to achieve during treatment and discuss how the patient can incorporate goal setting into their daily lives to aide in recovery. Orientation:   The focus of this group is to educate the patient on the purpose and policies of crisis stabilization and provide a format to answer questions about their admission.  The group details unit policies and expectations of patients while admitted.    Participation Level:  Active  Participation Quality:  Appropriate and Sharing  Affect:  Appropriate  Cognitive:  Appropriate  Insight: Appropriate  Engagement in Group:  Engaged  Modes of Intervention:  Discussion and Exploration  Additional Comments:    Jalie Eiland R Eliabeth Shoff 11/15/2023, 9:41 AM

## 2023-11-15 NOTE — Progress Notes (Signed)
  [facility] Adult Case Management Discharge Plan :  Will you be returning to the same living situation after discharge:  Yes,  patient will be returning to address on file.  At discharge, do you have transportation home?: No. CSW to arrange transport with taxi voucher through [place] [employer] at 9:45 am.  Do you have the ability to pay for your medications: No. Patient is uninsured. CSW requested samples from pharmacy.  Release of information consent forms completed and in the chart;  Patient's signature needed at discharge.  Patient to Follow up at:  Follow-up Information     [facility]. Call.   Why: PRIMARY CARE DOCTOR - OPTION 1 Contact information: [address] [place]  [phone]        [facility]. Call.   Why: PRIMARY CARE DOCTOR - OPTION 2 Contact information: [address] [place]  [phone]        [facility]. Call.   Why: PRIMARY CARE DOCTOR - OPTION 3 Contact information: [address] [place]  [address] [phone]        [facility] Follow up on 11/17/2023.   Why: Please call your provider on 11/17/23 at 9:00 am to schedule appointments for therapy and medication management services. Contact information: [address], [place], [place] [address] Phone: [phone] Fax:  [phone]        [facility]. Schedule an appointment as soon as possible for a visit.   Why: Please call this provider to schedule an appointment for primary care services, as soon as possible. Contact information: [address] [place]  [phone]                Next level of care provider has access to [facility] Link:no  Safety Planning and Suicide Prevention discussed: Yes,  completed with Sari Breen (significant other)  [phone].     Has patient been referred to the Quitline?: Patient refused referral for treatment  Patient has been referred for addiction treatment: No known substance use disorder.  Louetta Lame, LCSWA 11/15/2023, 9:04 AM

## 2023-11-15 NOTE — Progress Notes (Signed)
 Pt discharged to lobby. Pt was stable and appreciative at that time. All papers, samples and prescriptions were given and valuables returned. Verbal understanding expressed. Denies SI/HI and A/VH. Pt given opportunity to express concerns and ask questions.

## 2023-11-26 ENCOUNTER — Ambulatory Visit (INDEPENDENT_AMBULATORY_CARE_PROVIDER_SITE_OTHER): Admitting: Physician Assistant

## 2023-11-26 ENCOUNTER — Other Ambulatory Visit: Payer: Self-pay

## 2023-11-26 VITALS — BP 152/118 | HR 107 | Wt 182.8 lb

## 2023-11-26 DIAGNOSIS — F314 Bipolar disorder, current episode depressed, severe, without psychotic features: Secondary | ICD-10-CM | POA: Diagnosis not present

## 2023-11-26 DIAGNOSIS — F410 Panic disorder [episodic paroxysmal anxiety] without agoraphobia: Secondary | ICD-10-CM

## 2023-11-26 DIAGNOSIS — F411 Generalized anxiety disorder: Secondary | ICD-10-CM

## 2023-11-26 MED ORDER — CLONAZEPAM 0.5 MG PO TABS
0.5000 mg | ORAL_TABLET | Freq: Three times a day (TID) | ORAL | 0 refills | Status: DC | PRN
  Filled 2023-11-26: qty 45, 15d supply, fill #0

## 2023-11-26 MED ORDER — QUETIAPINE FUMARATE ER 300 MG PO TB24
300.0000 mg | ORAL_TABLET | Freq: Every day | ORAL | 1 refills | Status: DC
  Filled 2023-11-26: qty 30, 30d supply, fill #0

## 2023-11-26 MED ORDER — GABAPENTIN 300 MG PO CAPS
300.0000 mg | ORAL_CAPSULE | Freq: Three times a day (TID) | ORAL | 1 refills | Status: DC
  Filled 2023-11-26: qty 90, 30d supply, fill #0
  Filled 2024-01-24: qty 90, 30d supply, fill #1

## 2023-12-06 ENCOUNTER — Other Ambulatory Visit: Payer: Self-pay

## 2023-12-06 ENCOUNTER — Other Ambulatory Visit (HOSPITAL_COMMUNITY): Payer: Self-pay | Admitting: Physician Assistant

## 2023-12-06 DIAGNOSIS — F411 Generalized anxiety disorder: Secondary | ICD-10-CM

## 2023-12-06 DIAGNOSIS — F410 Panic disorder [episodic paroxysmal anxiety] without agoraphobia: Secondary | ICD-10-CM

## 2023-12-07 ENCOUNTER — Other Ambulatory Visit: Payer: Self-pay

## 2023-12-07 MED ORDER — IBUPROFEN 800 MG PO TABS
800.0000 mg | ORAL_TABLET | Freq: Three times a day (TID) | ORAL | 0 refills | Status: AC | PRN
  Filled 2023-12-07: qty 20, 7d supply, fill #0

## 2023-12-07 MED ORDER — AZITHROMYCIN 250 MG PO TABS
ORAL_TABLET | ORAL | 0 refills | Status: AC
  Filled 2023-12-07: qty 6, 5d supply, fill #0

## 2023-12-07 MED ORDER — CHLORHEXIDINE GLUCONATE 0.12 % MT SOLN
OROMUCOSAL | 0 refills | Status: AC
  Filled 2023-12-07: qty 473, 10d supply, fill #0

## 2023-12-07 NOTE — Telephone Encounter (Signed)
 Patient called after getting tooth extracted and said that his BP was elevated at 184/118. He also said that his Losartan  isn't working, but he got that when he was institutionalized. I explained to the patient that he had only seen you once and he had another appointment with Dr. Homer on 12/31/23. Patient acknowledged. I also explained that you could not refill medications that you did not prescribe them and that you are a phychiatric provider only. I encouraged the patient to go to the ER or urgent care for the blood pressure.

## 2023-12-10 ENCOUNTER — Other Ambulatory Visit: Payer: Self-pay

## 2023-12-13 ENCOUNTER — Telehealth (HOSPITAL_COMMUNITY): Payer: Self-pay | Admitting: Physician Assistant

## 2023-12-13 ENCOUNTER — Other Ambulatory Visit: Payer: Self-pay

## 2023-12-14 ENCOUNTER — Other Ambulatory Visit (HOSPITAL_COMMUNITY): Payer: Self-pay

## 2023-12-14 ENCOUNTER — Other Ambulatory Visit: Payer: Self-pay

## 2023-12-14 ENCOUNTER — Other Ambulatory Visit (HOSPITAL_COMMUNITY): Payer: Self-pay | Admitting: Physician Assistant

## 2023-12-14 DIAGNOSIS — F411 Generalized anxiety disorder: Secondary | ICD-10-CM

## 2023-12-14 DIAGNOSIS — F410 Panic disorder [episodic paroxysmal anxiety] without agoraphobia: Secondary | ICD-10-CM

## 2023-12-14 MED ORDER — CLONAZEPAM 0.5 MG PO TABS
0.5000 mg | ORAL_TABLET | Freq: Three times a day (TID) | ORAL | 0 refills | Status: DC | PRN
  Filled 2023-12-14: qty 45, 15d supply, fill #0

## 2023-12-14 NOTE — Telephone Encounter (Signed)
 Message acknowledged and reviewed. Patient's clonazepam  was e-prescribed to pharmacy of choice.

## 2023-12-16 ENCOUNTER — Telehealth (HOSPITAL_COMMUNITY): Payer: Self-pay | Admitting: Physician Assistant

## 2023-12-16 NOTE — Telephone Encounter (Signed)
 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 Says he would like assistance coming down off of it. Please advise. Thanks

## 2023-12-24 ENCOUNTER — Ambulatory Visit (INDEPENDENT_AMBULATORY_CARE_PROVIDER_SITE_OTHER): Admitting: Student in an Organized Health Care Education/Training Program

## 2023-12-24 ENCOUNTER — Other Ambulatory Visit: Payer: Self-pay

## 2023-12-24 DIAGNOSIS — F411 Generalized anxiety disorder: Secondary | ICD-10-CM

## 2023-12-24 DIAGNOSIS — Z758 Other problems related to medical facilities and other health care: Secondary | ICD-10-CM | POA: Diagnosis not present

## 2023-12-24 DIAGNOSIS — F410 Panic disorder [episodic paroxysmal anxiety] without agoraphobia: Secondary | ICD-10-CM | POA: Diagnosis not present

## 2023-12-24 DIAGNOSIS — F314 Bipolar disorder, current episode depressed, severe, without psychotic features: Secondary | ICD-10-CM

## 2023-12-24 DIAGNOSIS — G4700 Insomnia, unspecified: Secondary | ICD-10-CM | POA: Diagnosis not present

## 2023-12-24 MED ORDER — CLONAZEPAM 0.5 MG PO TABS
0.5000 mg | ORAL_TABLET | Freq: Three times a day (TID) | ORAL | 0 refills | Status: DC | PRN
  Filled 2023-12-24 – 2023-12-29 (×3): qty 45, 15d supply, fill #0

## 2023-12-24 MED ORDER — CLONAZEPAM 0.5 MG PO TABS
0.5000 mg | ORAL_TABLET | Freq: Three times a day (TID) | ORAL | 0 refills | Status: DC | PRN
  Filled 2023-12-24 – 2024-01-14 (×2): qty 45, 15d supply, fill #0

## 2023-12-24 MED ORDER — QUETIAPINE FUMARATE ER 200 MG PO TB24
200.0000 mg | ORAL_TABLET | Freq: Every day | ORAL | 0 refills | Status: DC
  Filled 2023-12-24: qty 7, 7d supply, fill #0

## 2023-12-24 MED ORDER — LURASIDONE HCL 20 MG PO TABS
20.0000 mg | ORAL_TABLET | Freq: Every day | ORAL | 0 refills | Status: DC
  Filled 2023-12-24: qty 14, 14d supply, fill #0

## 2023-12-24 MED ORDER — TRAZODONE HCL 50 MG PO TABS
50.0000 mg | ORAL_TABLET | Freq: Every evening | ORAL | 0 refills | Status: DC | PRN
  Filled 2023-12-24: qty 120, 60d supply, fill #0

## 2023-12-24 MED ORDER — CLONAZEPAM 0.5 MG PO TABS
0.5000 mg | ORAL_TABLET | Freq: Three times a day (TID) | ORAL | 0 refills | Status: DC | PRN
  Filled 2023-12-24 – 2024-01-28 (×3): qty 45, 15d supply, fill #0

## 2023-12-24 MED ORDER — LURASIDONE HCL 40 MG PO TABS
40.0000 mg | ORAL_TABLET | Freq: Every day | ORAL | 0 refills | Status: DC
  Filled 2023-12-24: qty 60, 60d supply, fill #0

## 2023-12-24 MED ORDER — QUETIAPINE FUMARATE 100 MG PO TABS
100.0000 mg | ORAL_TABLET | Freq: Every day | ORAL | 0 refills | Status: DC
  Filled 2023-12-24 – 2023-12-27 (×2): qty 7, 7d supply, fill #0

## 2023-12-24 NOTE — Patient Instructions (Addendum)
 Instructions on how to take medications Take trazodone  50 mg every night as needed, can take additional 50 mg the dose Start taking Latuda 20 mg daily with food for 14 days, then increase to Latuda 40 mg daily with food If you find this medication sedating take it with dinner in the evenings We will work on decreasing your Seroquel  XR 300 mg Decrease to Seroquel  XR 200 mg nightly for 7 days, THEN then decrease to Seroquel  100 mg nightly for 7 days and STOP

## 2023-12-24 NOTE — Progress Notes (Signed)
 BH MD Outpatient Progress Note  12/24/2023 12:48 PM VENCENT HAUSCHILD  MRN:  [id number]  Assessment:  HYDEN SOLEY presents for follow-up evaluation on 12/24/2023 .   Patient presents with ongoing bipolar depression and reports persistent sedation and cognitive side effects with Seroquel , without improvement in depressive symptoms. Based on his complaints, the plan is to transition to Latuda for management of bipolar depression. To maintain stability during this transition, a gradual taper of seroquel  will be implemented while latuda is titrated to an effective dose. Trazodone  will be added to support sleep hygiene throughout this process.  Patient has a history of clonazepam  use. Risks associated with long-term benzodiazepine use, including increased risk of early-onset dementia, falls, and delirium, were discussed. The need to work toward a gradual taper of clonazepam  was reviewed and will be addressed as part of his ongoing treatment plan.  A referral to his primary care provider was also placed to support comprehensive care and care coordination.   Identifying Information: JODEY BURBANO is a 58 y.o. male with a history of bipolar I disorder, GAD, Panic disorder, and nicotine  use disorder who is an established patient with [facility] for management of psychotropic medications. Initial evaluation Collene Reginia BRAVO, PA  completed on 11/26/2023. For a comprehensive history and detailed assessment, please refer to the initial adult assessment.  The patient's PMHx is significant for HTN and headaches.   Plan:  # Bipolar I disorder, most recent episode depressed #Insomnia Past medication trials:  Status of problem: worsening Interventions: Cross-taper: -- Tapering Seroquel  300 mg to 200 mg for 1 week, then 100 mg for 1 week then STOP -- Start Latuda 20 mg for 2 weeks then increase to 40 mg daily -Start Trazodone  50 mg at bedtime PRN, may repeat dose one time if needed for  sleep.   # GAD #Panic disorder Past medication trials:  buspar (GI upset) Status of problem: stable Interventions: -- Continue Klonopin  0.5 mg TID PRN  -- PDMP reviewed on 12/24/2023, patient is appropriately filling -- Gabapentin  change to how patient reports taking: 600 mg nightly  --Patient reports he has bottle left, no refills sent at this visit  # Nicotine  use disorder Past medication trials:  Status of problem: pre-contemplative Interventions: -- Encouraged cessation  Patient was given contact information for behavioral health clinic and was instructed to call 911 for emergencies.    Health Maintenance Needs PCP, referral sent on 12/24/2023  Subjective:  Chief Complaint:  Chief Complaint  Patient presents with   Follow-up    Interval History:  Patient reports ongoing dissatisfaction with his current regimen of seroquel , describing persistent brain fog and feeling generally unwell. He states that his depressive symptoms have remained unchanged while on this medication and finds it excessively sedating, resulting in possibly excessive sleep. He does not wish to reduce the dose but prefers to switch to a different medication for management of his bipolar depression. He is amenable to the addition of trazodone . Patient reports taking his medications as directed without missing doses. He describes ongoing tobacco use but denies recent alcohol or cannabis use. He denies suicidal ideation, homicidal ideation, or auditory or visual hallucinations.  Visit Diagnosis:    ICD-10-CM   1. Bipolar disorder, current episode depressed, severe, without psychotic features (HCC)  F31.4 lurasidone (LATUDA) 20 MG TABS tablet    lurasidone (LATUDA) 40 MG TABS tablet    QUEtiapine  (SEROQUEL  XR) 200 MG 24 hr tablet    QUEtiapine  (SEROQUEL ) 100 MG  tablet    2. GAD (generalized anxiety disorder)  F41.1 clonazePAM  (KLONOPIN ) 0.5 MG tablet    clonazePAM  (KLONOPIN ) 0.5 MG tablet    clonazePAM   (KLONOPIN ) 0.5 MG tablet    3. Panic disorder  F41.0 clonazePAM  (KLONOPIN ) 0.5 MG tablet    clonazePAM  (KLONOPIN ) 0.5 MG tablet    clonazePAM  (KLONOPIN ) 0.5 MG tablet    4. Insomnia, unspecified type  G47.00 traZODone  (DESYREL ) 50 MG tablet    5. Does not have primary care provider  Z75.8 Ambulatory referral to Internal Medicine    Ambulatory referral to Family Practice     Past Psychiatric Hx: Current Psychiatrist: Rosslyn Prost, [facility] Current Therapist: none Previous Psychiatric Diagnoses: Bipolar I, GAD  Psychiatric Medications: Current Olanzapine  5 mg once day Hydroxyzine  50 mg TID PRN Divalproex  ER 500 mg three times daily Clonazepam  0.5 mg TID PRN Past Quetiapine  50 mg  Eszopiclone 2 mg Brexpiprazole 1 mg Aripiprazole 2 mg Psychiatric Hospitalization hx: hospitalized 1 year ago for manic episode Psychotherapy hx: no history of therapy ACT team hx: none Neuromodulation history: none  History of suicide: none  History of homicide or aggression: none   Substance Use Hx: Alcohol: None Nicotine : 24 mg daily via nicotine  vape, not interested in cutting back Cannabis: None Non-prescribed medications: none Rehab History: none   Past Medical History: PCP: none Medical Dx: HTN, headaches Meds: losartan -hydrochlorothiazide 100-25 mg previously but not currently taking Allergies: Sulfa, penicillins Hospitalizations: did not discuss Surgeries: did not discuss TBI: none Seizures: none   Family History: family history includes Bipolar disorder in his sister; Cancer in his mother; Diabetes in his sister; Other in his father.  Psychiatric Dx: Bipolar disorder in sister Suicide Hx: none    Social History: Developmental: Father died when pt was 9. Raised by mother with cancer, who was frequently bedridden. Raised by a nanny b/c family was well off. No other concerns Current Living Situation: Lives with girlfriend and  son Education: Completed 1 year of college Occupation: Currently unemployed Hobbies: Previously played basketball, worked out at gym. Currently no hobbies Spirituality/religiosity: did not discuss Marital Status / Relationships: has girlfriend Children: 1 son Military: no   Legal: denies any upcoming court dates.  Access to firearms: none per patient   Social History   Socioeconomic History   Marital status: Married    Spouse name: Advertising Copywriter   Number of children: 1   Years of education: Not on file   Highest education level: Not on file  Occupational History   Occupation: custodian  Tobacco Use   Smoking status: Never   Smokeless tobacco: Current    Types: Chew  Vaping Use   Vaping status: Never Used  Substance and Sexual Activity   Alcohol use: Yes    Alcohol/week: 20.0 standard drinks of alcohol    Types: 20 Glasses of wine per week   Drug use: No   Sexual activity: Yes  Other Topics Concern   Not on file  Social History Narrative   Lives with girlfriend, 56 yo son.   Not exercising.   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    02/2017.   Social Drivers of Health   Tobacco Use: High Risk (11/12/2023)   Patient History    Smoking Tobacco Use: Never    Smokeless Tobacco Use: Current    Passive Exposure: Not on file  Financial Resource Strain: Low Risk (05/11/2022)   [facility]   Overall Financial  Resource Strain (CARDIA)    Difficulty of Paying Living Expenses: Not very hard  Food Insecurity: No Food Insecurity (11/10/2023)   Epic    Worried About Programme Researcher, Broadcasting/film/video in the Last Year: Never true    Ran Out of Food in the Last Year: Never true  Transportation Needs: No Transportation Needs (11/10/2023)   Epic    Lack of Transportation (Medical): No    Lack of Transportation (Non-Medical): No  Physical Activity: Inactive (05/11/2022)   Received from [facility]   Exercise Vital Sign    On average, how many days per week do you  engage in moderate to strenuous exercise (like a brisk walk)?: 0 days    On average, how many minutes do you engage in exercise at this level?: 0 min  Stress: Stress Concern Present (05/11/2022)   Received from [facility] - Occupational Stress Questionnaire    Feeling of Stress : To some extent  Social Connections: Moderately Integrated (05/11/2022)   Received from [facility]   Social Connection and Isolation Panel    In a typical week, how many times do you talk on the phone with family, friends, or neighbors?: More than three times a week    How often do you get together with friends or relatives?: Once a week    How often do you attend church or religious services?: More than 4 times per year    Do you belong to any clubs or organizations such as church groups, unions, fraternal or athletic groups, or school groups?: No    How often do you attend meetings of the clubs or organizations you belong to?: Never    Are you married, widowed, divorced, separated, never married, or living with a partner?: Living with partner  Depression (PHQ2-9): High Risk (11/26/2023)   Depression (PHQ2-9)    PHQ-2 Score: 26  Alcohol Screen: Low Risk (11/10/2023)   Alcohol Screen    Last Alcohol Screening Score (AUDIT): 0  Housing: Low Risk (11/10/2023)   Epic    Unable to Pay for Housing in the Last Year: No    Number of Times Moved in the Last Year: 0    Homeless in the Last Year: No  Utilities: Not At Risk (11/10/2023)   Epic    Threatened with loss of utilities: No  Health Literacy: Not on file    Allergies: Allergies[1]  Current Medications: Current Outpatient Medications  Medication Sig Dispense Refill   lurasidone (LATUDA) 20 MG TABS tablet Take 1 tablet (20 mg total) by mouth daily with breakfast. Please take with food 14 tablet 0   lurasidone (LATUDA) 40 MG TABS tablet Take 1 tablet (40 mg total) by mouth daily with breakfast. Take with food  60 tablet 0   QUEtiapine  (SEROQUEL  XR) 200 MG 24 hr tablet Take 1 tablet (200 mg total) by mouth at bedtime. 7 tablet 0   QUEtiapine  (SEROQUEL ) 100 MG tablet Take 1 tablet (100 mg total) by mouth at bedtime. 7 tablet 0   traZODone  (DESYREL ) 50 MG tablet Take 1 tablet (50 mg total) by mouth at bedtime as needed and may repeat dose one time if needed for sleep. 120 tablet 0   amLODipine  (NORVASC ) 10 MG tablet Take 1 tablet (10 mg total) by mouth daily. 30 tablet 0   chlorhexidine  (PERIDEX ) 0.12 % solution Swish for one minute and expectorate twice daily for 10 days. 473 mL 0   [  START ON 12/29/2023] clonazePAM  (KLONOPIN ) 0.5 MG tablet Take 1 tablet (0.5 mg total) by mouth 3 (three) times daily as needed for anxiety. 45 tablet 0   [START ON 01/13/2024] clonazePAM  (KLONOPIN ) 0.5 MG tablet Take 1 tablet (0.5 mg total) by mouth 3 (three) times daily as needed for anxiety. 45 tablet 0   [START ON 01/28/2024] clonazePAM  (KLONOPIN ) 0.5 MG tablet Take 1 tablet (0.5 mg total) by mouth 3 (three) times daily as needed for anxiety. 45 tablet 0   gabapentin  (NEURONTIN ) 300 MG capsule Take 1 capsule (300 mg total) by mouth 3 (three) times daily. 90 capsule 1   ibuprofen  (ADVIL ) 800 MG tablet Take 1 tablet every 8 hours as needed for pain 20 tablet 0   losartan  (COZAAR ) 50 MG tablet Take 1 tablet (50 mg total) by mouth daily. 30 tablet 0   nicotine  (NICODERM CQ  - DOSED IN MG/24 HOURS) 21 mg/24hr patch Place 1 patch (21 mg total) onto the skin daily.     nicotine  polacrilex (NICORETTE ) 4 MG gum Take 1 each (4 mg total) by mouth as needed for smoking cessation.     QUEtiapine  (SEROQUEL  XR) 300 MG 24 hr tablet Take 1 tablet (300 mg total) by mouth at bedtime. 30 tablet 1   Vitamin D , Ergocalciferol , (DRISDOL ) 1.25 MG (50000 UNIT) CAPS capsule Take 1 capsule (50,000 Units total) by mouth every 7 (seven) days. 8 capsule 0   No current facility-administered medications for this visit.    ROS: Review of Systems  All other  systems reviewed and are negative.   Objective:  Objective: Psychiatric Specialty Exam: General Appearance: Casual, fairly groomed  Eye Contact:  Good    Speech:  Clear, coherent, normal rate, spontaneous  Volume:  Normal   Mood:  see above  Affect:  Appropriate, congruent, full range  Thought Content: Logical, rumination   Suicidal Thoughts: see subjective  Thought Process:  Coherent, goal-directed, circumstantial   Orientation:  A&Ox4   Memory:  Immediate good  Judgment:  Fair   Insight:  Partial=  Concentration:  Attention and concentration good   Recall:  Good  Fund of Knowledge: Good  Language: Good, fluent  Psychomotor Activity: Normal  Akathisia:  NA   AIMS (if indicated): NA   Assets:   Communication Skills Desire for Improvement Social Support Talents/Skills  ADL's:  Intact  Cognition: WNL  Sleep: see above  Appetite: see above    Physical Exam Vitals reviewed.  Constitutional:      General: He is not in acute distress.    Appearance: He is not ill-appearing.  HENT:     Head: Normocephalic.  Eyes:     Extraocular Movements: Extraocular movements intact.     Conjunctiva/sclera: Conjunctivae normal.  Pulmonary:     Effort: Pulmonary effort is normal. No respiratory distress.  Neurological:     General: No focal deficit present.     Mental Status: He is alert.      Metabolic Disorder Labs: Lab Results  Component Value Date   HGBA1C 5.0 11/12/2023   MPG 96.8 11/12/2023   MPG 108.28 05/09/2022   Lab Results  Component Value Date   PROLACTIN 9.9 05/09/2022   Lab Results  Component Value Date   CHOL 199 11/12/2023   TRIG 129 11/12/2023   HDL 48 11/12/2023   CHOLHDL 4.1 11/12/2023   VLDL 26 11/12/2023   LDLCALC 125 (H) 11/12/2023   LDLCALC 113 (H) 05/09/2022   Lab Results  Component Value Date  TSH 0.939 11/12/2023   TSH 1.127 05/09/2022    Therapeutic Level Labs: No results found for: LITHIUM No results found for:  VALPROATE No results found for: CBMZ  Screenings:  AIMS    Flowsheet Row Office Visit from 11/26/2023 in [facility]  AIMS Total Score 6 0   AUDIT    Flowsheet Row [facility] ([facility]  Alcohol Use Disorder Identification Test Final Score (AUDIT) 0   GAD-7    Flowsheet Row Office Visit from 11/26/2023 in [facility]  Total GAD-7 Score 19   PHQ2-9    Flowsheet Row Office Visit from 11/26/2023 in [facility] Clinical Support from 02/19/2017 in [facility] Office Visit from 11/15/2015 in [facility]  PHQ-2 Total Score 6 0 6  PHQ-9 Total Score 26 -- 14   Flowsheet Row Office Visit from 11/26/2023 in [facility] Admission (Discharged) [facility] ED from 11[facility]09/2023 in [facility]  C-SSRS RISK CATEGORY Moderate Risk No Risk No Risk   Collaboration of Care:   Patient/Guardian was advised Release of Information must be obtained prior to any record release in order to collaborate their care with an outside provider. Patient/Guardian was advised if they have not already done so to contact the registration department to sign all necessary forms in order for us  to release information regarding their care.   Consent: Patient/Guardian gives verbal consent for treatment and assignment of benefits for services provided during this visit. Patient/Guardian expressed understanding and agreed to proceed.    [name], MD 12/24/2023, 12:48 PM      [1]  Allergies Allergen Reactions   Benadryl  [Diphenhydramine ] Other (See Comments)    Worsens depression   Doxycycline Other (See Comments)     Made him feel horrible   Sulfa Antibiotics Other (See Comments)    Unknown    Penicillins Rash

## 2023-12-27 ENCOUNTER — Other Ambulatory Visit: Payer: Self-pay

## 2023-12-27 ENCOUNTER — Telehealth (HOSPITAL_COMMUNITY): Payer: Self-pay | Admitting: Student in an Organized Health Care Education/Training Program

## 2023-12-27 ENCOUNTER — Other Ambulatory Visit (HOSPITAL_BASED_OUTPATIENT_CLINIC_OR_DEPARTMENT_OTHER): Payer: Self-pay

## 2023-12-28 NOTE — Telephone Encounter (Signed)
 On 12/12 I submitted a PCP referral for [facility].  I contacted and spoke with staff at the clinic this morning, who confirmed that the patient's referral is under review and that his insurance is appropriate for the clinic.

## 2023-12-29 ENCOUNTER — Other Ambulatory Visit: Payer: Self-pay

## 2023-12-31 ENCOUNTER — Encounter (HOSPITAL_COMMUNITY): Payer: Self-pay | Admitting: Student in an Organized Health Care Education/Training Program

## 2023-12-31 ENCOUNTER — Other Ambulatory Visit: Payer: Self-pay

## 2024-01-10 ENCOUNTER — Telehealth (HOSPITAL_COMMUNITY): Payer: Self-pay | Admitting: Student in an Organized Health Care Education/Training Program

## 2024-01-11 ENCOUNTER — Other Ambulatory Visit: Payer: Self-pay

## 2024-01-12 ENCOUNTER — Other Ambulatory Visit: Payer: Self-pay

## 2024-01-14 ENCOUNTER — Other Ambulatory Visit (HOSPITAL_COMMUNITY): Payer: Self-pay

## 2024-01-14 ENCOUNTER — Other Ambulatory Visit: Payer: Self-pay

## 2024-01-16 NOTE — Telephone Encounter (Signed)
 Dispense hx and PDMP reviewed. Patient had prescription of klonopin  0.5 mg TID PRN dispensed on 01/14/2023 for 15 days. He has a future prescription of klonopin  for the same dose an quantity to last him 01/28/2024-02/12/2024.

## 2024-01-24 ENCOUNTER — Other Ambulatory Visit: Payer: Self-pay

## 2024-01-25 ENCOUNTER — Telehealth (HOSPITAL_COMMUNITY): Payer: Self-pay | Admitting: Student in an Organized Health Care Education/Training Program

## 2024-01-25 ENCOUNTER — Other Ambulatory Visit: Payer: Self-pay

## 2024-01-25 DIAGNOSIS — F314 Bipolar disorder, current episode depressed, severe, without psychotic features: Secondary | ICD-10-CM

## 2024-01-25 DIAGNOSIS — G4700 Insomnia, unspecified: Secondary | ICD-10-CM

## 2024-01-25 MED ORDER — LAMOTRIGINE 100 MG PO TABS
100.0000 mg | ORAL_TABLET | Freq: Every day | ORAL | 0 refills | Status: DC
  Filled 2024-01-25: qty 30, 30d supply, fill #0

## 2024-01-25 MED ORDER — TRAZODONE HCL 150 MG PO TABS
150.0000 mg | ORAL_TABLET | Freq: Every day | ORAL | 0 refills | Status: DC
  Filled 2024-01-25: qty 90, 90d supply, fill #0

## 2024-01-25 MED ORDER — LURASIDONE HCL 40 MG PO TABS
40.0000 mg | ORAL_TABLET | Freq: Every day | ORAL | 0 refills | Status: DC
  Filled 2024-01-25 – 2024-01-31 (×2): qty 90, 90d supply, fill #0

## 2024-01-25 MED ORDER — LAMOTRIGINE 25 MG PO TABS
ORAL_TABLET | ORAL | 0 refills | Status: DC
  Filled 2024-01-25: qty 42, 28d supply, fill #0

## 2024-01-25 NOTE — Telephone Encounter (Signed)
 Patient called and said his meds are not working. He said he's gone up to 40 mg of Latuda . He said his depression is off the rails. He reported that the melatonin is causing restless leg syndrome and he is not sleeping well. He is not at work today due to his symptoms. The number where he can be reached is [phone]

## 2024-01-25 NOTE — Telephone Encounter (Signed)
 Contacted and spoke with the patient, who reports worsening depressive symptoms and persistent insomnia despite current treatment with Latuda  40 mg daily with food and trazodone  100 mg nightly. He denies suicidal ideation but states that overall he feels worse compared to the last visit.  The patient reports ongoing nausea since the Latuda  dose increase and does not wish to increase this medication further. He requests consideration of an additional agent to help with depressive symptoms. Discussed risks, benefits, and alternatives of initiating lithium versus lamotrigine . After review, the patient is more amenable to starting a lamotrigine  (Lamictal ) titration.  Quest the patient's sleep hygiene. Although trazodone  will be increased to 150 mg nightly, the patient was advised on behavioral modifications, including avoiding the bedroom until bedtime. He reports attempting to go to bed at 7:30 PM; discussed that this is likely too early and advised targeting a bedtime closer to 9:00 PM. Recommended taking sleep medications approximately 2-3 hours prior to bedtime to better align sedative effects with the desired sleep schedule. Encouraged regular daytime physical activity to promote improved sleep. The patient reports no evening nicotine  or caffeine use, which was reinforced as positive.  Assessment/Plan: Depression: Continue Latuda  40 mg daily; no further increase due to nausea. Initiate lamotrigine  titration (25 mg daily for 2 weeks, then increase to 50 mg daily for 2 weeks, then increase to 100 mg daily). Insomnia: Increase trazodone  to 150 mg nightly; reinforce sleep hygiene strategies as discussed. Safety: Denies suicidal ideation at this time

## 2024-01-27 ENCOUNTER — Other Ambulatory Visit: Payer: Self-pay

## 2024-01-28 ENCOUNTER — Other Ambulatory Visit: Payer: Self-pay

## 2024-01-31 ENCOUNTER — Telehealth (HOSPITAL_COMMUNITY): Payer: Self-pay

## 2024-01-31 ENCOUNTER — Other Ambulatory Visit: Payer: Self-pay

## 2024-02-08 ENCOUNTER — Telehealth (HOSPITAL_COMMUNITY): Payer: Self-pay | Admitting: Student in an Organized Health Care Education/Training Program

## 2024-02-08 NOTE — Telephone Encounter (Signed)
 I was notified by front desk staff that the patient called this morning requesting an update on a PCP referral placed in December. I contacted and spoke with the patient. He reports significant back pain following a workplace injury and stated he was unable to go to work today due to pain. I advised the patient to present to an urgent care or the emergency department for further evaluation given ongoing pain. Chart review shows pending status for referrals to [facility] placed on 12/24/2023.  I provided the patient with contact information for the [facility] to inquire about an earlier appointment.  The patient is scheduled to follow up with me on February 10, 2024

## 2024-02-10 ENCOUNTER — Other Ambulatory Visit: Payer: Self-pay

## 2024-02-10 ENCOUNTER — Telehealth (HOSPITAL_COMMUNITY): Admitting: Student in an Organized Health Care Education/Training Program

## 2024-02-10 DIAGNOSIS — F314 Bipolar disorder, current episode depressed, severe, without psychotic features: Secondary | ICD-10-CM

## 2024-02-10 DIAGNOSIS — Z720 Tobacco use: Secondary | ICD-10-CM

## 2024-02-10 DIAGNOSIS — F411 Generalized anxiety disorder: Secondary | ICD-10-CM

## 2024-02-10 DIAGNOSIS — F410 Panic disorder [episodic paroxysmal anxiety] without agoraphobia: Secondary | ICD-10-CM

## 2024-02-10 DIAGNOSIS — G4700 Insomnia, unspecified: Secondary | ICD-10-CM

## 2024-02-10 MED ORDER — LAMOTRIGINE 100 MG PO TABS
100.0000 mg | ORAL_TABLET | Freq: Every day | ORAL | 0 refills | Status: AC
  Filled 2024-02-10: qty 90, 90d supply, fill #0

## 2024-02-10 MED ORDER — TRAZODONE HCL 150 MG PO TABS
150.0000 mg | ORAL_TABLET | Freq: Every day | ORAL | 0 refills | Status: AC
  Filled 2024-02-10: qty 90, 90d supply, fill #0

## 2024-02-10 MED ORDER — CLONAZEPAM 0.5 MG PO TABS
0.5000 mg | ORAL_TABLET | Freq: Three times a day (TID) | ORAL | 0 refills | Status: AC | PRN
  Filled 2024-02-10: qty 90, 30d supply, fill #0

## 2024-02-10 MED ORDER — GABAPENTIN 300 MG PO CAPS
300.0000 mg | ORAL_CAPSULE | Freq: Three times a day (TID) | ORAL | 2 refills | Status: AC
  Filled 2024-02-10: qty 90, 30d supply, fill #0

## 2024-02-10 MED ORDER — LURASIDONE HCL 40 MG PO TABS
40.0000 mg | ORAL_TABLET | Freq: Every day | ORAL | 0 refills | Status: AC
  Filled 2024-02-10: qty 90, 90d supply, fill #0

## 2024-02-10 NOTE — Patient Instructions (Signed)
 Melatonin: What You Need to Know  What is Melatonin? Melatonin is a hormone naturally produced by the pineal gland in your brain. It helps regulate your sleep-wake cycle and signals your body when it's time to sleep.  Melatonin Supplements How They Work: Melatonin supplements can help with sleep, especially if your natural sleep cycle is disrupted (for example, by shift work or jet lag). When to Take: For best results, take melatonin 3 to 5 hours before your desired bedtime. It may take up to 2 weeks to notice the full benefits. Starting dose: 3 mg Maximum dose: 12 mg Use the lowest effective dose to minimize side effects.  Enhancing Your Body's Melatonin Production Exposure to blue spectrum light (from screens, LED lights, etc.) in the evening can suppress your body's natural melatonin production. Wearing amber-tinted glasses in the evening can help block blue light and stimulate your own melatonin production.  Possible Side Effects Sleepiness (especially if taken too late in the evening) Headaches Nausea Vivid dreams  Additional Tips Maintain a consistent sleep schedule. Create a relaxing bedtime routine. Limit screen time and bright lights in the evening.  If you have any questions or experience side effects, please let us  know at your next visit.

## 2024-02-10 NOTE — Progress Notes (Signed)
 [facility] MD Outpatient Progress Note  02/10/2024 10:52 AM [name]  MRN:  [id number]   Virtual Visit via Telephone Note  I connected with [name] on 02/10/24 at  9:00 AM EST by a video enabled telemedicine application and verified that I am speaking with the correct person using two identifiers.  Location: Patient: At work, private area Provider: Office   I discussed the limitations, risks, security and privacy concerns of performing an evaluation and management service by telephone and the availability of in person appointments. I also discussed with the patient that there may be a patient responsible charge related to this service. The patient expressed understanding and agreed to proceed.   I discussed the assessment and treatment plan with the patient. The patient was provided an opportunity to ask questions and all were answered. The patient agreed with the plan and demonstrated an understanding of the instructions.   The patient was advised to call back or seek an in-person evaluation if the symptoms worsen or if the condition fails to improve as anticipated.   Matthew Masson, MD Psych Resident, PGY-3      Assessment:  [name] presents for follow-up evaluation on 02/10/24 .   Since the last visit, the patient reports stable and improved mood with better overall affective control on Latuda  and the addition of Lamictal , with no symptoms suggesting mood destabilization. He continues to experience persistent insomnia, largely unchanged, despite interim titration of Trazodone  to 150 mg nightly, melatonin, and nightly gabapentin . Sleep difficulty appears multifactorial, with contributing behavioral factors. He endorsed use of melatonin up to 20 mg, and was advised against supratherapeutic dosing given lack of evidence for benefit and potential for circadian disruption. Ongoing nicotine  vaping likely contributes to sleep fragmentation; patient appears contemplative about  change, and counseling focused on limiting use at least two hours before bedtime. Extensive review of sleep hygiene and medication timing was completed.  An extensive discussion was held regarding gradual tapering of Klonopin  given long-term risks. Klonopin  is appropriate to continue at the current dose for now, with a plan to transition from TID dosing to BID dosing in a stepwise manner at future visits.   At todays appointment, Latuda  and Lamictal  remain appropriate to continue at current doses given reported benefit and tolerability. Plan is to maintain the current medication regimen, reinforce sleep hygiene strategies, and reassess insomnia and nicotine  use at the in-person follow-up in March.  Identifying Information: [name] is a 59 y.o. male with a history of bipolar I disorder, GAD, Panic disorder, and nicotine  use disorder who is an established patient with [facility] for management of psychotropic medications. Initial evaluation Matthew Reginia BRAVO, PA  completed on 11/26/2023. For a comprehensive history and detailed assessment, please refer to the initial adult assessment.  The patient's PMHx is significant for HTN and headaches.   Plan:  # Bipolar I disorder, most recent episode depressed #Insomnia Past medication trials: seroquel  Status of problem: Stabilizing Interventions: --Continue Latuda  40 mg daily with food --Continue Lamictal  titration, patient currently at 50 mg daily, plan to increase to 100 mg in 12 days   #Insomnia Past medication trials: seroquel  Status of problem: Chronic, unchanged from prior -- Continue CBT-I, with goal of improving sleep hygiene -- Recommended limiting nicotine  use up to 2 hours before bedtime -- Continue trazodone  150 mg as needed nightly -- Recommended patient reduce use of melatonin from 20 mg to 12 mg nightly   # GAD #Panic disorder  Past medication trials:  buspar (GI upset) Status of problem:  stable Interventions: -- Continue Klonopin  0.5 mg TID PRN  -- PDMP reviewed on 02/10/24, patient is appropriately filling  --Discussed working towards tapering down on this medication -- Gabapentin  change to how patient reports taking: 600 mg nightly  --Patient reports he has bottle left, no refills sent at this visit  # Nicotine  use disordervape Past medication trials:  Status of problem: pre-contemplative Interventions: -- Encouraged cessation  Patient was given contact information for behavioral health clinic and was instructed to call 911 for emergencies.    Health Maintenance Needs PCP, referral sent on 12/24/2023  Subjective:  Chief Complaint:  Chief Complaint  Patient presents with   Follow-up    Interval History:   At follow up today, patient reports he is currently taking lamotrigine  50 mg as part of an ongoing titration and states he has been compliant with his medications as directed. Patient reports ongoing anxiety and states he is taking clonazepam , averaging two tablets per day. Patient reports no current depressive symptoms and describes being engaged in therapy. Patient reports no adverse effects related to his medications.  Patient reports persistent sleep difficulties, stating it takes one to two hours to fall asleep, with frequent nighttime awakenings and approximately 30 minutes required to return to sleep, and he describes averaging six to seven hours of sleep per night. Patient reports he vapes approximately one hour before bedtime and states he was advised to limit vaping to at least two hours prior to sleep. Patient reports he has been taking melatonin at doses of 15 to 20 mg nightly and states psychoeducation was provided along with an after visit summary recommending limiting melatonin to no more than 12 mg. Patient reports caffeine intake of two cups in the morning and denies snoring, apneic spells, or waking with headaches.  Patient denies suicidal  ideation, homicidal ideation, and auditory or visual hallucinations.    Visit Diagnosis:    ICD-10-CM   1. Insomnia, unspecified type  G47.00 traZODone  (DESYREL ) 150 MG tablet    2. Bipolar disorder, current episode depressed, severe, without psychotic features (HCC)  F31.4 lurasidone  (LATUDA ) 40 MG TABS tablet    lamoTRIgine  (LAMICTAL ) 100 MG tablet    3. GAD (generalized anxiety disorder)  F41.1 gabapentin  (NEURONTIN ) 300 MG capsule    clonazePAM  (KLONOPIN ) 0.5 MG tablet    clonazePAM  (KLONOPIN ) 0.5 MG tablet    4. Panic disorder  F41.0 clonazePAM  (KLONOPIN ) 0.5 MG tablet    clonazePAM  (KLONOPIN ) 0.5 MG tablet    5. Current every day nicotine  vaping  Z72.0       Past Psychiatric Hx: Current Psychiatrist: Rosslyn Prost, [facility] Current Therapist: none Previous Psychiatric Diagnoses: Bipolar I, GAD  Psychiatric Medications: Current Olanzapine  5 mg once day Hydroxyzine  50 mg TID PRN Divalproex  ER 500 mg three times daily Clonazepam  0.5 mg TID PRN Past Quetiapine  50 mg  Eszopiclone 2 mg Brexpiprazole 1 mg Aripiprazole 2 mg Psychiatric Hospitalization hx: hospitalized 1 year ago for manic episode Psychotherapy hx: no history of therapy ACT team hx: none Neuromodulation history: none  History of suicide: none  History of homicide or aggression: none   Substance Use Hx: Alcohol: None Nicotine : 24 mg daily via nicotine  vape, not interested in cutting back Cannabis: None Non-prescribed medications: none Rehab History: none   Past Medical History: PCP: none Medical Dx: HTN, headaches Meds: losartan -hydrochlorothiazide 100-25 mg previously but not currently taking Allergies: Sulfa, penicillins Hospitalizations: did  not discuss Surgeries: did not discuss TBI: none Seizures: none   Family History: family history includes Bipolar disorder in his sister; Cancer in his mother; Diabetes in his sister; Other in his father.   Psychiatric Dx: Bipolar disorder in sister Suicide Hx: none    Social History: Developmental: Father died when pt was 9. Raised by mother with cancer, who was frequently bedridden. Raised by a nanny b/c family was well off. No other concerns Current Living Situation: Lives with girlfriend and son Education: Completed 1 year of college Occupation: Currently unemployed Hobbies: Previously played basketball, worked out at gym. Currently no hobbies Spirituality/religiosity: did not discuss Marital Status / Relationships: has girlfriend Children: 1 son Military: no   Legal: denies any upcoming court dates.  Access to firearms: none per patient   Social History   Socioeconomic History   Marital status: Married    Spouse name: Advertising Copywriter   Number of children: 1   Years of education: Not on file   Highest education level: Not on file  Occupational History   Occupation: custodian  Tobacco Use   Smoking status: Never   Smokeless tobacco: Current    Types: Chew  Vaping Use   Vaping status: Never Used  Substance and Sexual Activity   Alcohol use: Yes    Alcohol/week: 20.0 standard drinks of alcohol    Types: 20 Glasses of wine per week   Drug use: No   Sexual activity: Yes  Other Topics Concern   Not on file  Social History Narrative   Lives with girlfriend, 35 yo son.   Not exercising.   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    02/2017.   Social Drivers of Health   Tobacco Use: High Risk (11/12/2023)   Patient History    Smoking Tobacco Use: Never    Smokeless Tobacco Use: Current    Passive Exposure: Not on file  Financial Resource Strain: Low Risk (05/11/2022)   [facility]   Overall Financial Resource Strain (CARDIA)    Difficulty of Paying Living Expenses: Not very hard  Food Insecurity: No Food Insecurity (11/10/2023)   Epic    Worried About Radiation Protection Practitioner of Food in the Last Year: Never true    Ran Out of Food in the Last Year: Never  true  Transportation Needs: No Transportation Needs (11/10/2023)   Epic    Lack of Transportation (Medical): No    Lack of Transportation (Non-Medical): No  Physical Activity: Inactive (05/11/2022)   Received from [facility]   Exercise Vital Sign    On average, how many days per week do you engage in moderate to strenuous exercise (like a brisk walk)?: 0 days    On average, how many minutes do you engage in exercise at this level?: 0 min  Stress: Stress Concern Present (05/11/2022)   Received from [facility] - Occupational Stress Questionnaire    Feeling of Stress : To some extent  Social Connections: Moderately Integrated (05/11/2022)   Received from [facility]   Social Connection and Isolation Panel    In a typical week, how many times do you talk on the phone with family, friends, or neighbors?: More than three times a week    How often do you get together with friends or relatives?: Once a week    How often do you attend church or religious services?: More than 4 times per year  Do you belong to any clubs or organizations such as church groups, unions, fraternal or athletic groups, or school groups?: No    How often do you attend meetings of the clubs or organizations you belong to?: Never    Are you married, widowed, divorced, separated, never married, or living with a partner?: Living with partner  Depression (PHQ2-9): High Risk (11/26/2023)   Depression (PHQ2-9)    PHQ-2 Score: 26  Alcohol Screen: Low Risk (11/10/2023)   Alcohol Screen    Last Alcohol Screening Score (AUDIT): 0  Housing: Low Risk (11/10/2023)   Epic    Unable to Pay for Housing in the Last Year: No    Number of Times Moved in the Last Year: 0    Homeless in the Last Year: No  Utilities: Not At Risk (11/10/2023)   Epic    Threatened with loss of utilities: No  Health Literacy: Not on file    Allergies: Allergies[1]  Current Medications: Current  Outpatient Medications  Medication Sig Dispense Refill   amLODipine  (NORVASC ) 10 MG tablet Take 1 tablet (10 mg total) by mouth daily. 30 tablet 0   chlorhexidine  (PERIDEX ) 0.12 % solution Swish for one minute and expectorate twice daily for 10 days. 473 mL 0   [START ON 02/11/2024] clonazePAM  (KLONOPIN ) 0.5 MG tablet Take 1 tablet (0.5 mg total) by mouth 3 (three) times daily as needed for anxiety. 90 tablet 0   [START ON 04/10/2024] clonazePAM  (KLONOPIN ) 0.5 MG tablet Take 1 tablet (0.5 mg total) by mouth 3 (three) times daily as needed for anxiety. 90 tablet 0   gabapentin  (NEURONTIN ) 300 MG capsule Take 1 capsule (300 mg total) by mouth 3 (three) times daily. 90 capsule 2   ibuprofen  (ADVIL ) 800 MG tablet Take 1 tablet every 8 hours as needed for pain 20 tablet 0   [START ON 02/22/2024] lamoTRIgine  (LAMICTAL ) 100 MG tablet Take 1 tablet (100 mg total) by mouth daily. 90 tablet 0   losartan  (COZAAR ) 50 MG tablet Take 1 tablet (50 mg total) by mouth daily. 30 tablet 0   lurasidone  (LATUDA ) 40 MG TABS tablet Take 1 tablet (40 mg total) by mouth daily with breakfast. Take with food 90 tablet 0   nicotine  (NICODERM CQ  - DOSED IN MG/24 HOURS) 21 mg/24hr patch Place 1 patch (21 mg total) onto the skin daily.     nicotine  polacrilex (NICORETTE ) 4 MG gum Take 1 each (4 mg total) by mouth as needed for smoking cessation.     traZODone  (DESYREL ) 150 MG tablet Take 1 tablet (150 mg total) by mouth at bedtime. 90 tablet 0   Vitamin D , Ergocalciferol , (DRISDOL ) 1.25 MG (50000 UNIT) CAPS capsule Take 1 capsule (50,000 Units total) by mouth every 7 (seven) days. 8 capsule 0   No current facility-administered medications for this visit.    ROS: Review of Systems  All other systems reviewed and are negative.   Objective:  Objective: Psychiatric Specialty Exam: General Appearance: unable to asses via telephone visit  Eye Contact:  unable to asses via telephone visit  Speech:  Clear, coherent, normal  rate, spontaneous  Volume:  Normal   Mood:  see above  Affect:  unable to assess via telephone visit  Thought Content: Logical  Suicidal Thoughts: see subjective  Thought Process:  Coherent  Orientation:  Grossly intact   Memory:  Grossly intact  Judgment:  Fair   Insight:  Partial  Concentration:  Not formally assessed  Recall:  Not formally  assessed  Fund of Knowledge: Not formally assessed  Language: Not formally assessed  Psychomotor Activity: unable to asses via telephone visit  Akathisia:  NA   AIMS (if indicated): NA   Assets:   Communication Skills Desire for Improvement Social Support Talents/Skills  ADL's:  Intact  Cognition: WNL  Sleep: see above  Appetite: see above    Physical Exam Vitals reviewed.  Constitutional:      General: He is not in acute distress.    Appearance: He is not ill-appearing.  HENT:     Head: Normocephalic.  Eyes:     Extraocular Movements: Extraocular movements intact.     Conjunctiva/sclera: Conjunctivae normal.  Pulmonary:     Effort: Pulmonary effort is normal. No respiratory distress.  Neurological:     General: No focal deficit present.     Mental Status: He is alert.      Metabolic Disorder Labs: Lab Results  Component Value Date   HGBA1C 5.0 11/12/2023   MPG 96.8 11/12/2023   MPG 108.28 05/09/2022   Lab Results  Component Value Date   PROLACTIN 9.9 05/09/2022   Lab Results  Component Value Date   CHOL 199 11/12/2023   TRIG 129 11/12/2023   HDL 48 11/12/2023   CHOLHDL 4.1 11/12/2023   VLDL 26 11/12/2023   LDLCALC 125 (H) 11/12/2023   LDLCALC 113 (H) 05/09/2022   Lab Results  Component Value Date   TSH 0.939 11/12/2023   TSH 1.127 05/09/2022    Therapeutic Level Labs: No results found for: LITHIUM No results found for: VALPROATE No results found for: CBMZ  Screenings:  AIMS    Flowsheet Row Office Visit from 11/26/2023 in [facility] Admission (Discharged[facility]  [facility]  AIMS Total Score 6 0   AUDIT    Flowsheet Row Ad[facility] (Discharged[facility]  Alcohol Use Disorder Identification Test Final Score (AUDIT) 0   GAD-7    Flowsheet Row Office Visit from 11/26/2023 in [facility]  Total GAD-7 Score 19   PHQ2-9    Flowsheet Row Office Visit from 11/26/2023 in [facility] Clinical Support from 02/19/2017 in [facility] Office Visit from 11/15/2015 in [facility]  PHQ-2 Total Score 6 0 6  PHQ-9 Total Score 26 -- 14   Flowsheet Row Office Visit from 11/26/2023 in [facility] Admission (Discharged) from [facility] ED from 11[facility]09/2023 in [facility] Emergency [facility]  C-SSRS RISK CATEGORY Moderate Risk No Risk No Risk   Collaboration of Care:   Patient/Guardian was advised Release of Information must be obtained prior to any record release in order to collaborate their care with an outside provider. Patient/Guardian was advised if they have not already done so to contact the registration department to sign all necessary forms in order for us  to release information regarding their care.   Consent: Patient/Guardian gives verbal consent for treatment and assignment of benefits for services provided during this visit. Patient/Guardian expressed understanding and agreed to proceed.    Matthew Masson, MD 02/10/2024, 10:52 AM       [1]  Allergies Allergen Reactions   Benadryl  [Diphenhydramine ] Other (See Comments)    Worsens depression   Doxycycline Other (See Comments)    Made him feel horrible   Sulfa Antibiotics Other (See Comments)  Unknown    Penicillins Rash

## 2024-02-11 ENCOUNTER — Other Ambulatory Visit: Payer: Self-pay

## 2024-02-11 MED ORDER — HYDROCODONE-ACETAMINOPHEN 5-325 MG PO TABS
ORAL_TABLET | ORAL | 0 refills | Status: AC
  Filled 2024-02-11: qty 28, 7d supply, fill #0

## 2024-02-11 MED ORDER — LOSARTAN POTASSIUM 100 MG PO TABS
100.0000 mg | ORAL_TABLET | Freq: Every day | ORAL | 0 refills | Status: AC
  Filled 2024-02-11: qty 90, 90d supply, fill #0

## 2024-02-11 MED ORDER — PREDNISONE 20 MG PO TABS
ORAL_TABLET | ORAL | 0 refills | Status: AC
  Filled 2024-02-11: qty 10, 5d supply, fill #0

## 2024-02-12 ENCOUNTER — Encounter (HOSPITAL_COMMUNITY): Payer: Self-pay | Admitting: Physician Assistant

## 2024-03-30 ENCOUNTER — Encounter (HOSPITAL_COMMUNITY): Admitting: Student in an Organized Health Care Education/Training Program
